# Patient Record
Sex: Female | Born: 1965 | Race: Black or African American | Hispanic: No | Marital: Married | Smoking: Never smoker
Health system: Southern US, Community
[De-identification: ages and names within clinical notes are randomized; demographics above are authoritative.]

## PROBLEM LIST (undated history)

## (undated) DIAGNOSIS — J45909 Unspecified asthma, uncomplicated: Secondary | ICD-10-CM

## (undated) DIAGNOSIS — I10 Essential (primary) hypertension: Secondary | ICD-10-CM

## (undated) DIAGNOSIS — Z8489 Family history of other specified conditions: Secondary | ICD-10-CM

## (undated) DIAGNOSIS — G43909 Migraine, unspecified, not intractable, without status migrainosus: Secondary | ICD-10-CM

## (undated) DIAGNOSIS — F419 Anxiety disorder, unspecified: Secondary | ICD-10-CM

## (undated) DIAGNOSIS — T7840XA Allergy, unspecified, initial encounter: Secondary | ICD-10-CM

## (undated) DIAGNOSIS — G4700 Insomnia, unspecified: Secondary | ICD-10-CM

## (undated) DIAGNOSIS — K219 Gastro-esophageal reflux disease without esophagitis: Secondary | ICD-10-CM

## (undated) HISTORY — DX: Migraine, unspecified, not intractable, without status migrainosus: G43.909

## (undated) HISTORY — DX: Anxiety disorder, unspecified: F41.9

## (undated) HISTORY — PX: TONSILLECTOMY: SUR1361

## (undated) HISTORY — DX: Unspecified asthma, uncomplicated: J45.909

## (undated) HISTORY — PX: EYE SURGERY: SHX253

## (undated) HISTORY — PX: COLONOSCOPY: SHX174

## (undated) HISTORY — DX: Insomnia, unspecified: G47.00

## (undated) HISTORY — DX: Gastro-esophageal reflux disease without esophagitis: K21.9

## (undated) HISTORY — PX: OTHER SURGICAL HISTORY: SHX169

## (undated) HISTORY — DX: Allergy, unspecified, initial encounter: T78.40XA

---

## 2003-09-30 DIAGNOSIS — I10 Essential (primary) hypertension: Secondary | ICD-10-CM

## 2003-09-30 HISTORY — DX: Essential (primary) hypertension: I10

## 2011-02-22 ENCOUNTER — Emergency Department (HOSPITAL_COMMUNITY): Payer: PRIVATE HEALTH INSURANCE

## 2011-02-22 ENCOUNTER — Emergency Department (HOSPITAL_COMMUNITY)
Admission: EM | Admit: 2011-02-22 | Discharge: 2011-02-22 | Disposition: A | Discharge status: Home / Self Care | Payer: PRIVATE HEALTH INSURANCE | Attending: Emergency Medicine | Admitting: Emergency Medicine

## 2011-02-22 DIAGNOSIS — R059 Cough, unspecified: Secondary | ICD-10-CM | POA: Insufficient documentation

## 2011-02-22 DIAGNOSIS — R0609 Other forms of dyspnea: Secondary | ICD-10-CM | POA: Insufficient documentation

## 2011-02-22 DIAGNOSIS — R079 Chest pain, unspecified: Secondary | ICD-10-CM | POA: Insufficient documentation

## 2011-02-22 DIAGNOSIS — R0602 Shortness of breath: Secondary | ICD-10-CM | POA: Insufficient documentation

## 2011-02-22 DIAGNOSIS — R0989 Other specified symptoms and signs involving the circulatory and respiratory systems: Secondary | ICD-10-CM | POA: Insufficient documentation

## 2011-02-22 DIAGNOSIS — R05 Cough: Secondary | ICD-10-CM | POA: Insufficient documentation

## 2011-02-22 DIAGNOSIS — R062 Wheezing: Secondary | ICD-10-CM | POA: Insufficient documentation

## 2011-02-22 DIAGNOSIS — M542 Cervicalgia: Secondary | ICD-10-CM | POA: Insufficient documentation

## 2011-02-22 DIAGNOSIS — R609 Edema, unspecified: Secondary | ICD-10-CM | POA: Insufficient documentation

## 2011-02-22 LAB — CK TOTAL AND CKMB (NOT AT ARMC)
CK, MB: 1.5 ng/mL (ref 0.3–4.0)
Relative Index: 0.8 (ref 0.0–2.5)

## 2011-02-22 LAB — CBC
HCT: 37.3 % (ref 36.0–46.0)
Hemoglobin: 12.2 g/dL (ref 12.0–15.0)
MCHC: 32.7 g/dL (ref 30.0–36.0)
RBC: 4.27 MIL/uL (ref 3.87–5.11)
WBC: 5.7 10*3/uL (ref 4.0–10.5)

## 2011-02-22 LAB — DIFFERENTIAL
Basophils Absolute: 0 10*3/uL (ref 0.0–0.1)
Lymphs Abs: 3 10*3/uL (ref 0.7–4.0)
Monocytes Absolute: 0.5 10*3/uL (ref 0.1–1.0)
Neutrophils Relative %: 36 % — ABNORMAL LOW (ref 43–77)

## 2011-02-22 LAB — TROPONIN I: Troponin I: <0.3 ng/mL (ref <0.30)

## 2011-02-22 LAB — POCT I-STAT, CHEM 8
BUN: 9 mg/dL (ref 6–23)
Calcium, Ion: 0.92 mmol/L — ABNORMAL LOW (ref 1.12–1.32)
Chloride: 108 mEq/L (ref 96–112)
Glucose, Bld: 90 mg/dL (ref 70–99)
HCT: 39 % (ref 36.0–46.0)
TCO2: 23 mmol/L (ref 0–100)

## 2011-02-22 LAB — D-DIMER, QUANTITATIVE: D-Dimer, Quant: 0.37 ug/mL-FEU (ref 0.00–0.48)

## 2012-04-27 IMAGING — CR DG CHEST 2V
2 series · 2 of 2 positions shown · non-contrast
Comparison: None.

CLINICAL DATA: Chest pain and shortness of breath.

CHEST - 2 VIEW

[w chest pa]
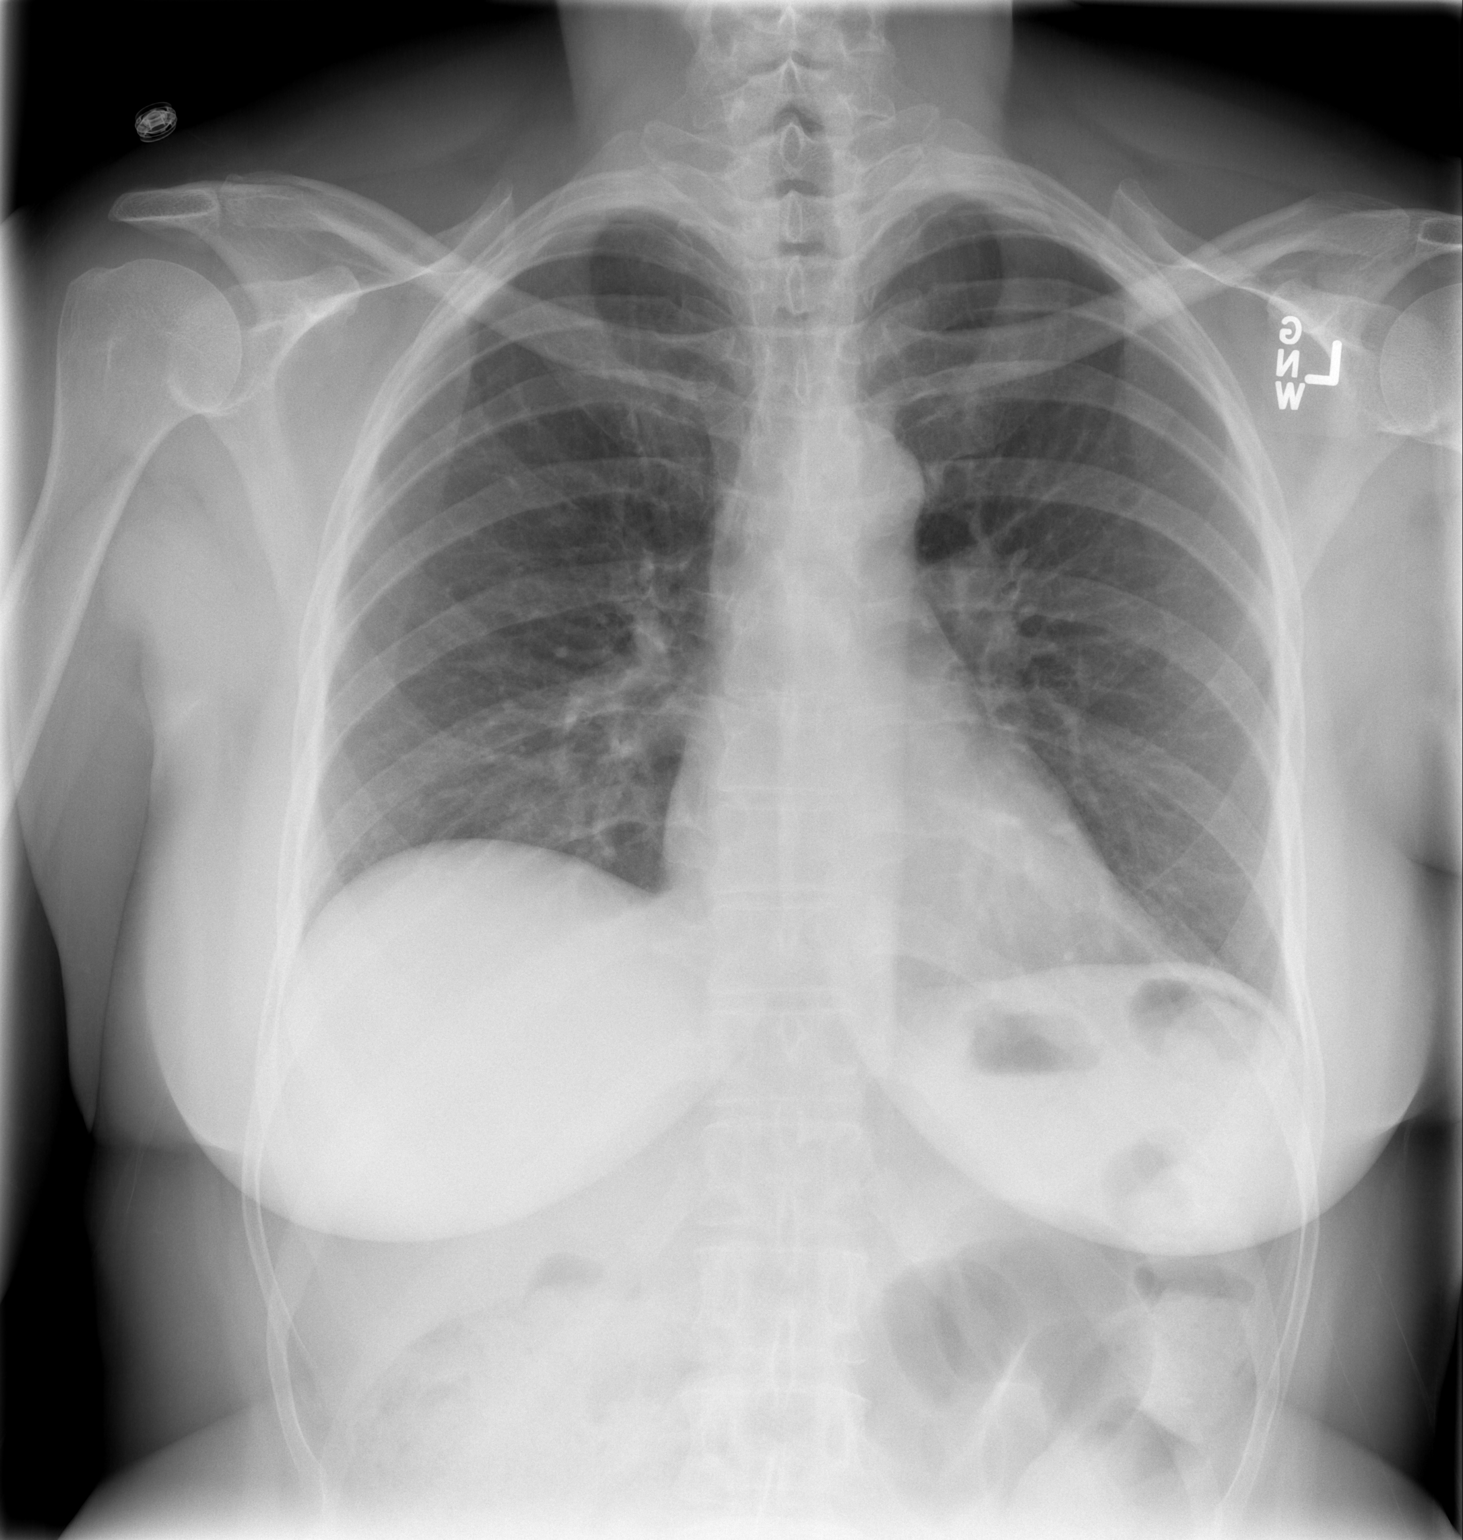

[w chest lat]
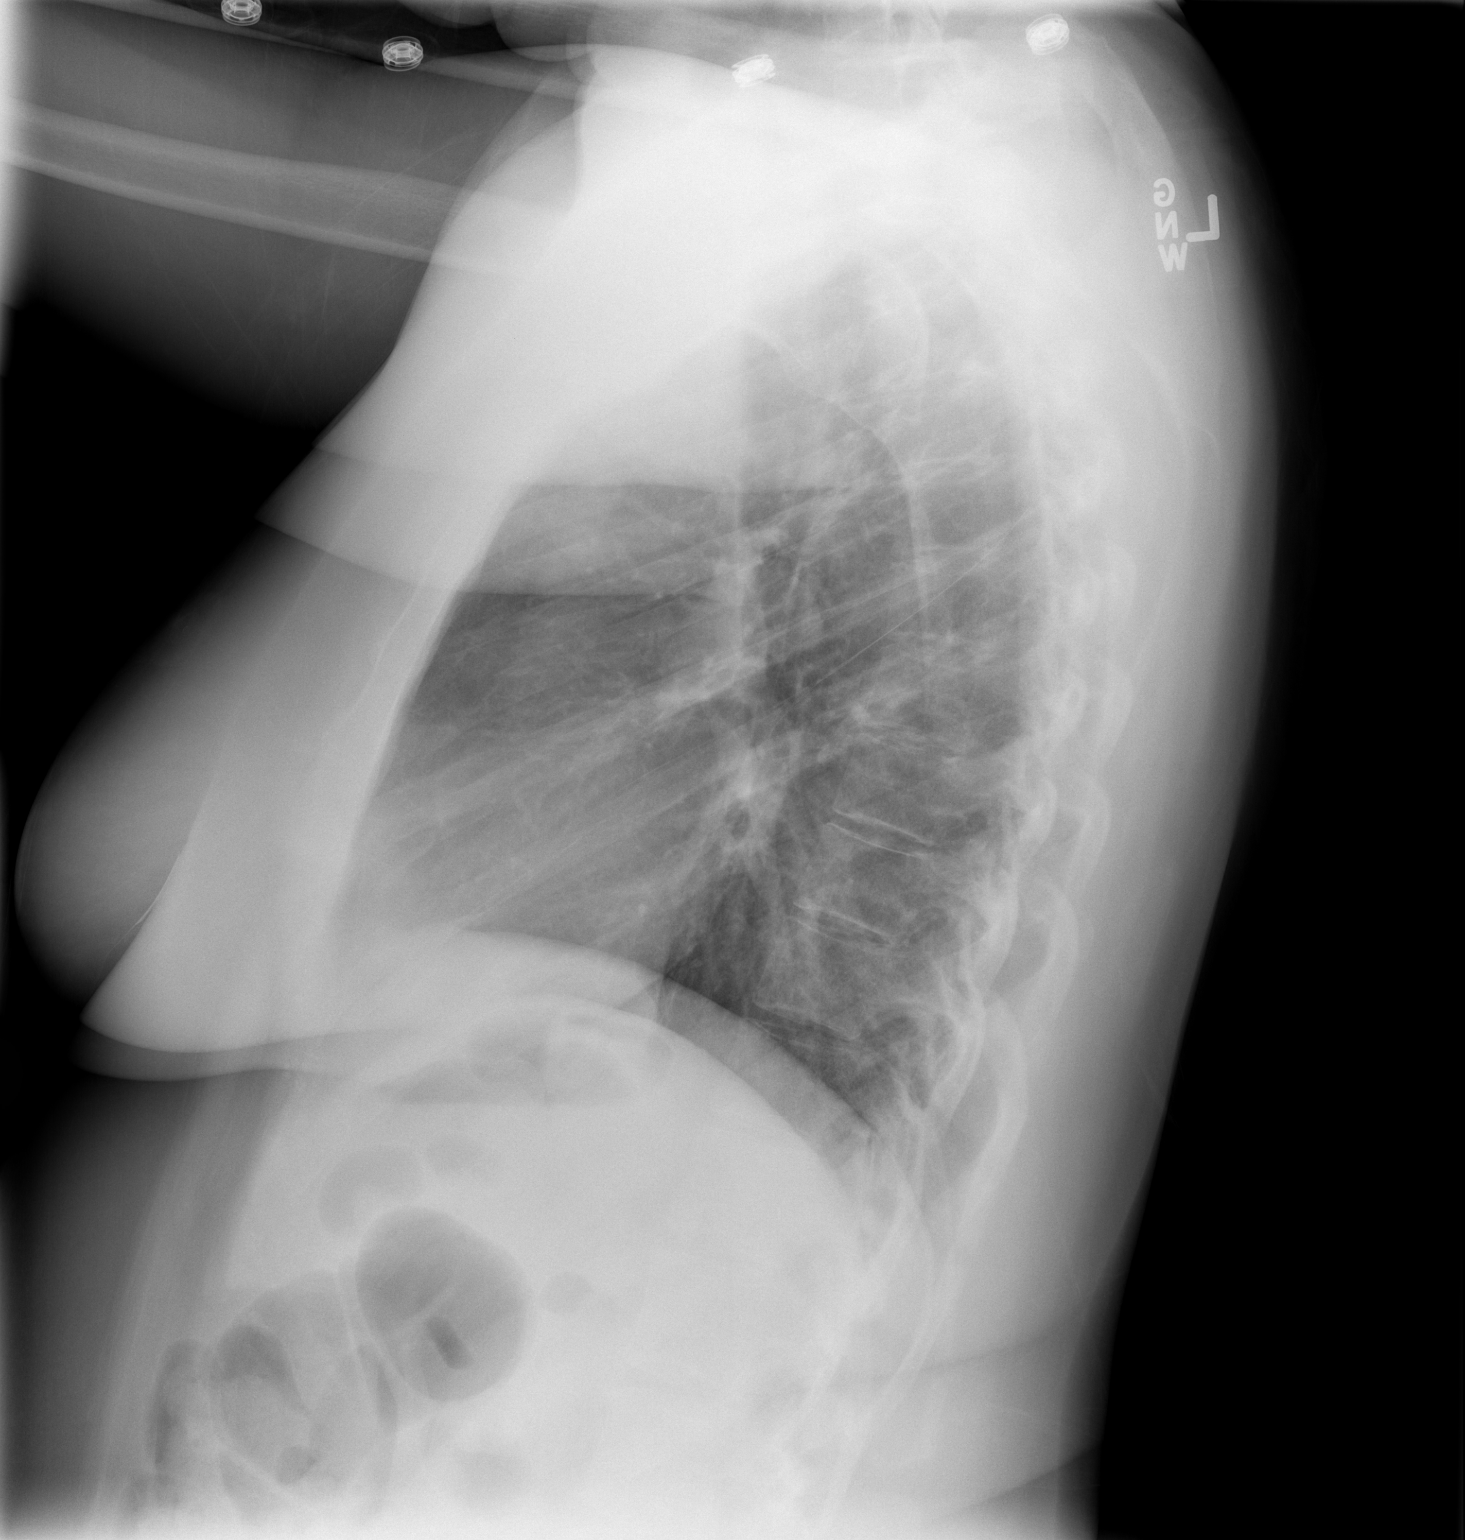

[2 of 2 positions shown; findings below may reference images not displayed]

FINDINGS: The lungs are well-aerated and clear.  There is no
evidence of focal opacification, pleural effusion or pneumothorax.

The heart is normal in size; the mediastinal contour is within
normal limits.  No acute osseous abnormalities are seen.
IMPRESSION: No acute cardiopulmonary process seen.

## 2012-06-15 ENCOUNTER — Other Ambulatory Visit: Payer: Self-pay | Admitting: Gastroenterology

## 2012-06-15 DIAGNOSIS — R1011 Right upper quadrant pain: Secondary | ICD-10-CM

## 2012-06-15 DIAGNOSIS — R110 Nausea: Secondary | ICD-10-CM

## 2012-06-22 ENCOUNTER — Encounter (HOSPITAL_COMMUNITY)
Admission: RE | Admit: 2012-06-22 | Discharge: 2012-06-22 | Disposition: A | Discharge status: Home / Self Care | Payer: BC Managed Care – PPO | Source: Ambulatory Visit | Attending: Gastroenterology | Admitting: Gastroenterology

## 2012-06-22 ENCOUNTER — Ambulatory Visit (HOSPITAL_COMMUNITY)
Admission: RE | Admit: 2012-06-22 | Discharge: 2012-06-22 | Disposition: A | Discharge status: Home / Self Care | Payer: BC Managed Care – PPO | Source: Ambulatory Visit | Attending: Gastroenterology | Admitting: Gastroenterology

## 2012-06-22 DIAGNOSIS — R110 Nausea: Secondary | ICD-10-CM

## 2012-06-22 DIAGNOSIS — R1011 Right upper quadrant pain: Secondary | ICD-10-CM

## 2012-06-22 MED ORDER — SINCALIDE 5 MCG IJ SOLR
0.0200 ug/kg | Freq: Once | INTRAMUSCULAR | Status: AC
  Administered 2012-06-22: 2 ug via INTRAVENOUS

## 2012-06-22 MED ORDER — TECHNETIUM TC 99M MEBROFENIN IV KIT
5.0000 | PACK | Freq: Once | INTRAVENOUS | Status: AC | PRN
  Administered 2012-06-22: 5 via INTRAVENOUS

## 2012-07-05 ENCOUNTER — Encounter (INDEPENDENT_AMBULATORY_CARE_PROVIDER_SITE_OTHER): Payer: Self-pay | Admitting: Surgery

## 2012-07-14 ENCOUNTER — Ambulatory Visit (INDEPENDENT_AMBULATORY_CARE_PROVIDER_SITE_OTHER): Payer: BC Managed Care – PPO | Admitting: Surgery

## 2012-07-14 ENCOUNTER — Encounter (INDEPENDENT_AMBULATORY_CARE_PROVIDER_SITE_OTHER): Payer: Self-pay | Admitting: Surgery

## 2012-07-14 VITALS — BP 124/81 | HR 68 | Temp 98.1°F | Resp 12 | Ht 68.0 in | Wt 214.2 lb

## 2012-07-14 DIAGNOSIS — K811 Chronic cholecystitis: Secondary | ICD-10-CM

## 2012-07-14 NOTE — Progress Notes (Signed)
Patient ID: Teresa Hunter, female   DOB: [birth year], 46 y.o.   MRN: [id number]  Chief Complaint  Patient presents with  . Other    RUQ abdominal pain    HPI Teresa Hunter is a 46 y.o. female.   HPI This is a pleasant female referred by Dr. Loreta Ave for evaluation of right upper quadrant abdominal pain. This has been going on for about 3 years. They were first the epigastrium. It is episodic. Sometimes it is related to fatty meals and other times it occur spontaneously. It is mild to moderate in intensity. She has nausea but no vomiting. Bowel movements are normal. Past Medical History  Diagnosis Date  . Migraine headache   . Insomnia   . Anxiety   . GERD (gastroesophageal reflux disease)     Past Surgical History  Procedure Date  . Tonsillectomy   . Laser     B/L glaucoma    History reviewed. No pertinent family history.  Social History History  Substance Use Topics  . Smoking status: Never Smoker   . Smokeless tobacco: Not on file  . Alcohol Use: No    No Known Allergies  Current Outpatient Prescriptions  Medication Sig Dispense Refill  . ALPRAZolam (XANAX PO) Take by mouth.      . Esomeprazole Magnesium (NEXIUM PO) Take by mouth.      . Zolpidem Tartrate (AMBIEN PO) Take by mouth.        Review of Systems Review of Systems  Constitutional: Negative for fever, chills and unexpected weight change.  HENT: Negative for hearing loss, congestion, sore throat, trouble swallowing and voice change.   Eyes: Negative for visual disturbance.  Respiratory: Negative for cough and wheezing.   Cardiovascular: Negative for chest pain, palpitations and leg swelling.  Gastrointestinal: Positive for nausea, abdominal pain and abdominal distention. Negative for vomiting, diarrhea, constipation, blood in stool and anal bleeding.  Genitourinary: Negative for hematuria, vaginal bleeding and difficulty urinating.  Musculoskeletal: Negative for arthralgias.  Skin: Negative for rash and  wound.  Neurological: Negative for seizures, syncope and headaches.  Hematological: Negative for adenopathy. Does not bruise/bleed easily.  Psychiatric/Behavioral: Negative for confusion.    Blood pressure 124/81, pulse 68, temperature 98.1 F (36.7 C), temperature source Temporal, resp. rate 12, height 5\' 8"  (1.727 m), weight 214 lb 3.2 oz (97.16 kg), last menstrual period 11/23/2011.  Physical Exam Physical Exam  Constitutional: She is oriented to person, place, and time. She appears well-nourished. No distress.  HENT:  Head: Normocephalic and atraumatic.  Right Ear: External ear normal.  Left Ear: External ear normal.  Nose: Nose normal.  Mouth/Throat: Oropharynx is clear and moist. No oropharyngeal exudate.  Eyes: Conjunctivae normal are normal. Pupils are equal, round, and reactive to light. Right eye exhibits no discharge. Left eye exhibits no discharge. No scleral icterus.  Neck: Normal range of motion. Neck supple. No tracheal deviation present. No thyromegaly present.  Cardiovascular: Normal rate, regular rhythm, normal heart sounds and intact distal pulses.   No murmur heard. Pulmonary/Chest: Effort normal and breath sounds normal. No respiratory distress. She has no wheezes. She has no rales.  Abdominal: Soft. Bowel sounds are normal. She exhibits no distension. There is no tenderness. There is no rebound.  Musculoskeletal: Normal range of motion. She exhibits no edema and no tenderness.  Lymphadenopathy:    She has no cervical adenopathy.  Neurological: She is alert and oriented to person, place, and time.  Skin: Skin is warm and dry. No rash noted.  She is not diaphoretic. No erythema.  Psychiatric: Her behavior is normal. Judgment normal.    Data Reviewed I have reviewed the notes including her ultrasound, HIDA scan, and liver function tests which are normal  Assessment    Biliary dyskinesia    Plan    I suspect she has dyskinesia and probable chronic  cholecystitis. I discussed this with the patient and her husband. They wish to proceed with left upper cholecystectomy and possible cholangiogram. I discussed the risks of surgery which includes but is not limited to bleeding, infection, bile duct injury, bile leak, injury to other structures, the need to convert to an open procedure, the chance this may not resolve her symptoms, etc. I also discussed postoperative recovery. They understand and wish to proceed. Likelihood of success is good       Talon Witting A 07/14/2012, 11:14 AM

## 2012-07-22 ENCOUNTER — Encounter (HOSPITAL_COMMUNITY): Payer: Self-pay | Admitting: Pharmacy Technician

## 2012-07-27 NOTE — Pre-Procedure Instructions (Signed)
20 Teresa Hunter  07/27/2012   Your procedure is scheduled on:  Friday August 06, 2012  Report to [facility] Stay [facility] at 8:00 AM.  Call this number if you have problems the morning of surgery: [phone]   Remember:   Do not eat food or drink :After Midnight.      Take these medicines the morning of surgery with A SIP OF WATER: xanax, nexium, tramadol   Do not wear jewelry, make-up or nail polish.  Do not wear lotions, powders, or perfumes.   Do not shave 48 hours prior to surgery. Men may shave face and neck.  Do not bring valuables to the hospital.  Contacts, dentures or bridgework may not be worn into surgery.  Leave suitcase in the car. After surgery it may be brought to your room.  For patients admitted to the hospital, checkout time is 11:00 AM the day of discharge.   Patients discharged the day of surgery will not be allowed to drive home.  Name and phone number of your driver: family / friend  Special Instructions: Shower using CHG 2 nights before surgery and the night before surgery.  If you shower the day of surgery use CHG.  Use special wash - you have one bottle of CHG for all showers.  You should use approximately 1/3 of the bottle for each shower.   Please read over the following fact sheets that you were given: Pain Booklet, Coughing and Deep Breathing, MRSA Information and Surgical Site Infection Prevention

## 2012-07-28 ENCOUNTER — Encounter (HOSPITAL_COMMUNITY)
Admission: RE | Admit: 2012-07-28 | Discharge: 2012-07-28 | Disposition: A | Discharge status: Home / Self Care | Payer: BC Managed Care – PPO | Source: Ambulatory Visit | Attending: Surgery | Admitting: Surgery

## 2012-07-28 ENCOUNTER — Ambulatory Visit (HOSPITAL_COMMUNITY)
Admission: RE | Admit: 2012-07-28 | Discharge: 2012-07-28 | Disposition: A | Discharge status: Home / Self Care | Payer: BC Managed Care – PPO | Source: Ambulatory Visit | Attending: Surgery | Admitting: Surgery

## 2012-07-28 ENCOUNTER — Encounter (HOSPITAL_COMMUNITY): Payer: Self-pay

## 2012-07-28 DIAGNOSIS — Z01812 Encounter for preprocedural laboratory examination: Secondary | ICD-10-CM | POA: Insufficient documentation

## 2012-07-28 DIAGNOSIS — Z01818 Encounter for other preprocedural examination: Secondary | ICD-10-CM | POA: Insufficient documentation

## 2012-07-28 DIAGNOSIS — Z01810 Encounter for preprocedural cardiovascular examination: Secondary | ICD-10-CM | POA: Insufficient documentation

## 2012-07-28 HISTORY — DX: Essential (primary) hypertension: I10

## 2012-07-28 HISTORY — DX: Family history of other specified conditions: Z84.89

## 2012-07-28 LAB — BASIC METABOLIC PANEL
CO2: 26 mEq/L (ref 19–32)
Calcium: 9.3 mg/dL (ref 8.4–10.5)
Chloride: 102 mEq/L (ref 96–112)
Glucose, Bld: 101 mg/dL — ABNORMAL HIGH (ref 70–99)
Sodium: 136 mEq/L (ref 135–145)

## 2012-07-28 LAB — CBC
Hemoglobin: 12.9 g/dL (ref 12.0–15.0)
MCH: 29.5 pg (ref 26.0–34.0)
RBC: 4.37 MIL/uL (ref 3.87–5.11)
WBC: 4.5 10*3/uL (ref 4.0–10.5)

## 2012-07-28 LAB — SURGICAL PCR SCREEN: Staphylococcus aureus: NEGATIVE

## 2012-07-29 ENCOUNTER — Other Ambulatory Visit (HOSPITAL_COMMUNITY): Payer: BC Managed Care – PPO

## 2012-08-05 MED ORDER — CEFAZOLIN SODIUM-DEXTROSE 2-3 GM-% IV SOLR
2.0000 g | INTRAVENOUS | Status: AC
  Administered 2012-08-06: 2 g via INTRAVENOUS
  Filled 2012-08-05: qty 50

## 2012-08-05 NOTE — H&P (Signed)
Patient ID: Teresa Hunter, female DOB: 10/10/1965, 46 y.o. MRN: [id number]  Chief Complaint   Patient presents with   .  Other     RUQ abdominal pain    HPI  Teresa Hunter is a 46 y.o. female.  HPI  This is a pleasant female referred by Dr. Loreta Ave for evaluation of right upper quadrant abdominal pain. This has been going on for about 3 years. They were first the epigastrium. It is episodic. Sometimes it is related to fatty meals and other times it occur spontaneously. It is mild to moderate in intensity. She has nausea but no vomiting. Bowel movements are normal.  Past Medical History   Diagnosis  Date   .  Migraine headache    .  Insomnia    .  Anxiety    .  GERD (gastroesophageal reflux disease)     Past Surgical History   Procedure  Date   .  Tonsillectomy    .  Laser      B/L glaucoma    History reviewed. No pertinent family history.  Social History  History   Substance Use Topics   .  Smoking status:  Never Smoker   .  Smokeless tobacco:  Not on file   .  Alcohol Use:  No    No Known Allergies  Current Outpatient Prescriptions   Medication  Sig  Dispense  Refill   .  ALPRAZolam (XANAX PO)  Take by mouth.     .  Esomeprazole Magnesium (NEXIUM PO)  Take by mouth.     .  Zolpidem Tartrate (AMBIEN PO)  Take by mouth.      Review of Systems  Review of Systems  Constitutional: Negative for fever, chills and unexpected weight change.  HENT: Negative for hearing loss, congestion, sore throat, trouble swallowing and voice change.  Eyes: Negative for visual disturbance.  Respiratory: Negative for cough and wheezing.  Cardiovascular: Negative for chest pain, palpitations and leg swelling.  Gastrointestinal: Positive for nausea, abdominal pain and abdominal distention. Negative for vomiting, diarrhea, constipation, blood in stool and anal bleeding.  Genitourinary: Negative for hematuria, vaginal bleeding and difficulty urinating.  Musculoskeletal: Negative for arthralgias.    Skin: Negative for rash and wound.  Neurological: Negative for seizures, syncope and headaches.  Hematological: Negative for adenopathy. Does not bruise/bleed easily.  Psychiatric/Behavioral: Negative for confusion.   Blood pressure 124/81, pulse 68, temperature 98.1 F (36.7 C), temperature source Temporal, resp. rate 12, height 5\' 8"  (1.727 m), weight 214 lb 3.2 oz (97.16 kg), last menstrual period 11/23/2011.  Physical Exam  Physical Exam  Constitutional: She is oriented to person, place, and time. She appears well-nourished. No distress.  HENT:  Head: Normocephalic and atraumatic.  Right Ear: External ear normal.  Left Ear: External ear normal.  Nose: Nose normal.  Mouth/Throat: Oropharynx is clear and moist. No oropharyngeal exudate.  Eyes: Conjunctivae normal are normal. Pupils are equal, round, and reactive to light. Right eye exhibits no discharge. Left eye exhibits no discharge. No scleral icterus.  Neck: Normal range of motion. Neck supple. No tracheal deviation present. No thyromegaly present.  Cardiovascular: Normal rate, regular rhythm, normal heart sounds and intact distal pulses.  No murmur heard.  Pulmonary/Chest: Effort normal and breath sounds normal. No respiratory distress. She has no wheezes. She has no rales.  Abdominal: Soft. Bowel sounds are normal. She exhibits no distension. There is no tenderness. There is no rebound.  Musculoskeletal: Normal range of motion. She exhibits no  edema and no tenderness.  Lymphadenopathy:  She has no cervical adenopathy.  Neurological: She is alert and oriented to person, place, and time.  Skin: Skin is warm and dry. No rash noted. She is not diaphoretic. No erythema.  Psychiatric: Her behavior is normal. Judgment normal.   Data Reviewed  I have reviewed the notes including her ultrasound, HIDA scan, and liver function tests which are normal  Assessment   Biliary dyskinesia   Plan   I suspect she has dyskinesia and  probable chronic cholecystitis. I discussed this with the patient and her husband. They wish to proceed with left upper cholecystectomy and possible cholangiogram. I discussed the risks of surgery which includes but is not limited to bleeding, infection, bile duct injury, bile leak, injury to other structures, the need to convert to an open procedure, the chance this may not resolve her symptoms, etc. I also discussed postoperative recovery. They understand and wish to proceed. Likelihood of success is good   Gatsby Chismar A

## 2012-08-06 ENCOUNTER — Ambulatory Visit (HOSPITAL_COMMUNITY): Payer: BC Managed Care – PPO | Admitting: Anesthesiology

## 2012-08-06 ENCOUNTER — Ambulatory Visit (HOSPITAL_COMMUNITY)
Admission: RE | Admit: 2012-08-06 | Discharge: 2012-08-06 | Disposition: A | Discharge status: Home / Self Care | Payer: BC Managed Care – PPO | Source: Ambulatory Visit | Attending: Surgery | Admitting: Surgery

## 2012-08-06 ENCOUNTER — Encounter (HOSPITAL_COMMUNITY)
Admission: RE | Disposition: A | Discharge status: Home / Self Care | Payer: Self-pay | Source: Ambulatory Visit | Attending: Surgery

## 2012-08-06 ENCOUNTER — Encounter (HOSPITAL_COMMUNITY): Payer: Self-pay | Admitting: *Deleted

## 2012-08-06 ENCOUNTER — Encounter (HOSPITAL_COMMUNITY): Payer: Self-pay | Admitting: Anesthesiology

## 2012-08-06 DIAGNOSIS — G43909 Migraine, unspecified, not intractable, without status migrainosus: Secondary | ICD-10-CM | POA: Insufficient documentation

## 2012-08-06 DIAGNOSIS — Z79899 Other long term (current) drug therapy: Secondary | ICD-10-CM | POA: Insufficient documentation

## 2012-08-06 DIAGNOSIS — G4700 Insomnia, unspecified: Secondary | ICD-10-CM | POA: Insufficient documentation

## 2012-08-06 DIAGNOSIS — K819 Cholecystitis, unspecified: Secondary | ICD-10-CM | POA: Insufficient documentation

## 2012-08-06 DIAGNOSIS — K219 Gastro-esophageal reflux disease without esophagitis: Secondary | ICD-10-CM | POA: Insufficient documentation

## 2012-08-06 DIAGNOSIS — K811 Chronic cholecystitis: Secondary | ICD-10-CM

## 2012-08-06 DIAGNOSIS — K824 Cholesterolosis of gallbladder: Secondary | ICD-10-CM

## 2012-08-06 DIAGNOSIS — F411 Generalized anxiety disorder: Secondary | ICD-10-CM | POA: Insufficient documentation

## 2012-08-06 HISTORY — PX: CHOLECYSTECTOMY: SHX55

## 2012-08-06 SURGERY — LAPAROSCOPIC CHOLECYSTECTOMY
Anesthesia: General | Site: Abdomen | Wound class: Contaminated

## 2012-08-06 MED ORDER — OXYCODONE HCL 5 MG/5ML PO SOLN: 5.0000 mg | Freq: Once | ORAL | Status: AC | PRN

## 2012-08-06 MED ORDER — HYDROMORPHONE HCL PF 1 MG/ML IJ SOLN
INTRAMUSCULAR | Status: AC
  Filled 2012-08-06: qty 1

## 2012-08-06 MED ORDER — ONDANSETRON HCL 4 MG/2ML IJ SOLN
INTRAMUSCULAR | Status: DC | PRN
  Administered 2012-08-06: 4 mg via INTRAVENOUS

## 2012-08-06 MED ORDER — LABETALOL HCL 5 MG/ML IV SOLN
INTRAVENOUS | Status: DC | PRN
  Administered 2012-08-06: 10 mg via INTRAVENOUS

## 2012-08-06 MED ORDER — KETOROLAC TROMETHAMINE 30 MG/ML IJ SOLN
INTRAMUSCULAR | Status: DC | PRN
  Administered 2012-08-06: 30 mg via INTRAVENOUS

## 2012-08-06 MED ORDER — FENTANYL CITRATE 0.05 MG/ML IJ SOLN: 50.0000 ug | Freq: Once | INTRAMUSCULAR | Status: DC

## 2012-08-06 MED ORDER — GLYCOPYRROLATE 0.2 MG/ML IJ SOLN
INTRAMUSCULAR | Status: DC | PRN
  Administered 2012-08-06: 0.4 mg via INTRAVENOUS

## 2012-08-06 MED ORDER — MIDAZOLAM HCL 2 MG/2ML IJ SOLN: 1.0000 mg | INTRAMUSCULAR | Status: DC | PRN

## 2012-08-06 MED ORDER — OXYCODONE HCL 5 MG PO TABS: 5.0000 mg | ORAL_TABLET | ORAL | Status: DC | PRN

## 2012-08-06 MED ORDER — LACTATED RINGERS IV SOLN
INTRAVENOUS | Status: DC
  Administered 2012-08-06: 08:00:00 via INTRAVENOUS

## 2012-08-06 MED ORDER — ROCURONIUM BROMIDE 100 MG/10ML IV SOLN
INTRAVENOUS | Status: DC | PRN
  Administered 2012-08-06: 30 mg via INTRAVENOUS

## 2012-08-06 MED ORDER — HYDROMORPHONE HCL PF 1 MG/ML IJ SOLN
0.2500 mg | INTRAMUSCULAR | Status: DC | PRN
  Administered 2012-08-06 (×2): 0.5 mg via INTRAVENOUS

## 2012-08-06 MED ORDER — MIDAZOLAM HCL 5 MG/5ML IJ SOLN
INTRAMUSCULAR | Status: DC | PRN
  Administered 2012-08-06: 2 mg via INTRAVENOUS

## 2012-08-06 MED ORDER — OXYCODONE-ACETAMINOPHEN 5-325 MG PO TABS: ORAL_TABLET | ORAL | Status: DC

## 2012-08-06 MED ORDER — BUPIVACAINE-EPINEPHRINE PF 0.25-1:200000 % IJ SOLN
INTRAMUSCULAR | Status: AC
  Filled 2012-08-06: qty 30

## 2012-08-06 MED ORDER — LIDOCAINE HCL (CARDIAC) 20 MG/ML IV SOLN
INTRAVENOUS | Status: DC | PRN
  Administered 2012-08-06: 80 mg via INTRAVENOUS

## 2012-08-06 MED ORDER — OXYCODONE HCL 5 MG PO TABS
ORAL_TABLET | ORAL | Status: AC
  Filled 2012-08-06: qty 1

## 2012-08-06 MED ORDER — OXYCODONE HCL 5 MG PO TABS
5.0000 mg | ORAL_TABLET | Freq: Once | ORAL | Status: AC | PRN
  Administered 2012-08-06: 5 mg via ORAL

## 2012-08-06 MED ORDER — PROMETHAZINE HCL 25 MG/ML IJ SOLN: 6.2500 mg | INTRAMUSCULAR | Status: DC | PRN

## 2012-08-06 MED ORDER — 0.9 % SODIUM CHLORIDE (POUR BTL) OPTIME
TOPICAL | Status: DC | PRN
  Administered 2012-08-06: 1000 mL

## 2012-08-06 MED ORDER — BUPIVACAINE-EPINEPHRINE 0.25% -1:200000 IJ SOLN
INTRAMUSCULAR | Status: DC | PRN
  Administered 2012-08-06: 20 mL

## 2012-08-06 MED ORDER — FENTANYL CITRATE 0.05 MG/ML IJ SOLN
INTRAMUSCULAR | Status: DC | PRN
  Administered 2012-08-06: 50 ug via INTRAVENOUS
  Administered 2012-08-06: 100 ug via INTRAVENOUS

## 2012-08-06 MED ORDER — NEOSTIGMINE METHYLSULFATE 1 MG/ML IJ SOLN
INTRAMUSCULAR | Status: DC | PRN
  Administered 2012-08-06: 3 mg via INTRAVENOUS

## 2012-08-06 MED ORDER — PROPOFOL 10 MG/ML IV BOLUS
INTRAVENOUS | Status: DC | PRN
  Administered 2012-08-06: 180 mg via INTRAVENOUS

## 2012-08-06 MED ORDER — SODIUM CHLORIDE 0.9 % IR SOLN
Status: DC | PRN
  Administered 2012-08-06: 1000 mL

## 2012-08-06 SURGICAL SUPPLY — 40 items
APPLIER CLIP 5 13 M/L LIGAMAX5 (MISCELLANEOUS) ×2
BANDAGE ADHESIVE 1X3 (GAUZE/BANDAGES/DRESSINGS) ×8 IMPLANT
BENZOIN TINCTURE PRP APPL 2/3 (GAUZE/BANDAGES/DRESSINGS) ×2 IMPLANT
CANISTER SUCTION 2500CC (MISCELLANEOUS) ×2 IMPLANT
CHLORAPREP W/TINT 26ML (MISCELLANEOUS) ×2 IMPLANT
CLIP APPLIE 5 13 M/L LIGAMAX5 (MISCELLANEOUS) ×1 IMPLANT
CLOTH BEACON ORANGE TIMEOUT ST (SAFETY) ×2 IMPLANT
COVER MAYO STAND STRL (DRAPES) IMPLANT
COVER SURGICAL LIGHT HANDLE (MISCELLANEOUS) ×2 IMPLANT
DECANTER SPIKE VIAL GLASS SM (MISCELLANEOUS) ×2 IMPLANT
DRAPE C-ARM 42X72 X-RAY (DRAPES) IMPLANT
ELECT REM PT RETURN 9FT ADLT (ELECTROSURGICAL) ×2
ELECTRODE REM PT RTRN 9FT ADLT (ELECTROSURGICAL) ×1 IMPLANT
GLOVE BIO SURGEON STRL SZ7.5 (GLOVE) ×2 IMPLANT
GLOVE BIOGEL PI IND STRL 6.5 (GLOVE) ×1 IMPLANT
GLOVE BIOGEL PI IND STRL 7.5 (GLOVE) ×1 IMPLANT
GLOVE BIOGEL PI INDICATOR 6.5 (GLOVE) ×1
GLOVE BIOGEL PI INDICATOR 7.5 (GLOVE) ×1
GLOVE SURG SIGNA 7.5 PF LTX (GLOVE) ×2 IMPLANT
GLOVE SURG SS PI 6.5 STRL IVOR (GLOVE) ×2 IMPLANT
GOWN PREVENTION PLUS XLARGE (GOWN DISPOSABLE) ×2 IMPLANT
GOWN STRL NON-REIN LRG LVL3 (GOWN DISPOSABLE) ×6 IMPLANT
KIT BASIN OR (CUSTOM PROCEDURE TRAY) ×2 IMPLANT
KIT ROOM TURNOVER OR (KITS) ×2 IMPLANT
NS IRRIG 1000ML POUR BTL (IV SOLUTION) ×2 IMPLANT
PAD ARMBOARD 7.5X6 YLW CONV (MISCELLANEOUS) ×4 IMPLANT
POUCH SPECIMEN RETRIEVAL 10MM (ENDOMECHANICALS) ×2 IMPLANT
SCISSORS LAP 5X35 DISP (ENDOMECHANICALS) ×2 IMPLANT
SET CHOLANGIOGRAPH 5 50 .035 (SET/KITS/TRAYS/PACK) IMPLANT
SET IRRIG TUBING LAPAROSCOPIC (IRRIGATION / IRRIGATOR) ×2 IMPLANT
SLEEVE ENDOPATH XCEL 5M (ENDOMECHANICALS) ×4 IMPLANT
SPECIMEN JAR SMALL (MISCELLANEOUS) ×2 IMPLANT
SUT MON AB 4-0 PC3 18 (SUTURE) ×2 IMPLANT
SUT VICRYL 0 UR6 27IN ABS (SUTURE) ×2 IMPLANT
TOWEL OR 17X24 6PK STRL BLUE (TOWEL DISPOSABLE) ×2 IMPLANT
TOWEL OR 17X26 10 PK STRL BLUE (TOWEL DISPOSABLE) ×2 IMPLANT
TRAY LAPAROSCOPIC (CUSTOM PROCEDURE TRAY) ×2 IMPLANT
TROCAR XCEL BLUNT TIP 100MML (ENDOMECHANICALS) ×2 IMPLANT
TROCAR XCEL NON-BLD 5MMX100MML (ENDOMECHANICALS) ×2 IMPLANT
WATER STERILE IRR 1000ML POUR (IV SOLUTION) IMPLANT

## 2012-08-06 NOTE — Anesthesia Procedure Notes (Signed)
Procedure Name: Intubation Date/Time: 08/06/2012 8:54 AM Performed by: Arlice Colt B Pre-anesthesia Checklist: Patient identified, Emergency Drugs available, Suction available, Patient being monitored and Timeout performed Patient Re-evaluated:Patient Re-evaluated prior to inductionOxygen Delivery Method: Circle system utilized Preoxygenation: Pre-oxygenation with 100% oxygen Intubation Type: IV induction Ventilation: Mask ventilation without difficulty Grade View: Grade II Tube size: 7.5 mm Number of attempts: 2 Airway Equipment and Method: Video-laryngoscopy and Stylet Placement Confirmation: ETT inserted through vocal cords under direct vision,  positive ETCO2 and breath sounds checked- equal and bilateral Secured at: 22 cm Tube secured with: Tape Dental Injury: Teeth and Oropharynx as per pre-operative assessment

## 2012-08-06 NOTE — Transfer of Care (Signed)
Immediate Anesthesia Transfer of Care Note  Patient: Teresa Hunter  Procedure(s) Performed: Procedure(s) (LRB) with comments: LAPAROSCOPIC CHOLECYSTECTOMY (N/A)  Patient Location: PACU  Anesthesia Type:General  Level of Consciousness: awake, alert  and oriented  Airway & Oxygen Therapy: Patient Spontanous Breathing  Post-op Assessment: Report given to PACU RN and Post -op Vital signs reviewed and stable  Post vital signs: Reviewed and stable  Complications: No apparent anesthesia complications

## 2012-08-06 NOTE — Op Note (Signed)

## 2012-08-06 NOTE — Anesthesia Preprocedure Evaluation (Signed)
Anesthesia Evaluation  Patient identified by MRN, date of birth, ID band Patient awake    Reviewed: Allergy & Precautions, H&P , NPO status , Patient's Chart, lab work & pertinent test results  Airway Mallampati: I TM Distance: >3 FB Neck ROM: Full    Dental   Pulmonary  breath sounds clear to auscultation        Cardiovascular hypertension, Rhythm:Regular Rate:Normal     Neuro/Psych  Headaches,    GI/Hepatic GERD-  ,  Endo/Other    Renal/GU      Musculoskeletal   Abdominal (+) + obese,   Peds  Hematology   Anesthesia Other Findings   Reproductive/Obstetrics                           Anesthesia Physical Anesthesia Plan  ASA: II  Anesthesia Plan: General   Post-op Pain Management:    Induction: Intravenous  Airway Management Planned: Oral ETT  Additional Equipment:   Intra-op Plan:   Post-operative Plan: Extubation in OR  Informed Consent: I have reviewed the patients History and Physical, chart, labs and discussed the procedure including the risks, benefits and alternatives for the proposed anesthesia with the patient or authorized representative who has indicated his/her understanding and acceptance.     Plan Discussed with: CRNA and Surgeon  Anesthesia Plan Comments:         Anesthesia Quick Evaluation

## 2012-08-06 NOTE — Anesthesia Postprocedure Evaluation (Signed)
  Anesthesia Post-op Note  Patient: Teresa Hunter  Procedure(s) Performed: Procedure(s) (LRB) with comments: LAPAROSCOPIC CHOLECYSTECTOMY (N/A)  Patient Location: PACU  Anesthesia Type:General  Level of Consciousness: awake  Airway and Oxygen Therapy: Patient Spontanous Breathing  Post-op Pain: mild  Post-op Assessment: Post-op Vital signs reviewed, Patient's Cardiovascular Status Stable, Respiratory Function Stable, Patent Airway, No signs of Nausea or vomiting and Pain level controlled  Post-op Vital Signs: stable  Complications: No apparent anesthesia complications

## 2012-08-06 NOTE — Interval H&P Note (Signed)
History and Physical Interval Note:  No change in H and P  08/06/2012 8:07 AM  Teresa Hunter  has presented today for surgery, with the diagnosis of chronic cholecystitis  The various methods of treatment have been discussed with the patient and family. After consideration of risks, benefits and other options for treatment, the patient has consented to  Procedure(s) (LRB) with comments: LAPAROSCOPIC CHOLECYSTECTOMY (N/A) as a surgical intervention .  The patient's history has been reviewed, patient examined, no change in status, stable for surgery.  I have reviewed the patient's chart and labs.  Questions were answered to the patient's satisfaction.     Landon Truax A

## 2012-08-09 ENCOUNTER — Encounter (HOSPITAL_COMMUNITY): Payer: Self-pay | Admitting: Surgery

## 2012-08-23 ENCOUNTER — Ambulatory Visit (INDEPENDENT_AMBULATORY_CARE_PROVIDER_SITE_OTHER): Payer: BC Managed Care – PPO | Admitting: Surgery

## 2012-08-23 ENCOUNTER — Encounter (INDEPENDENT_AMBULATORY_CARE_PROVIDER_SITE_OTHER): Payer: Self-pay | Admitting: Surgery

## 2012-08-23 VITALS — BP 132/88 | HR 76 | Temp 97.6°F | Resp 16 | Ht 68.0 in | Wt 211.0 lb

## 2012-08-23 DIAGNOSIS — Z09 Encounter for follow-up examination after completed treatment for conditions other than malignant neoplasm: Secondary | ICD-10-CM

## 2012-08-23 NOTE — Progress Notes (Signed)
Subjective:     Patient ID: Teresa Hunter, female   DOB: [birth year], 46 y.o.   MRN: [id number]  HPI She is here for her first postop visit status post laparoscopic cholecystectomy. She is doing moderately well but has some mild postop discomfort.  Review of Systems     Objective:   Physical Exam On exam, her incisions are healing well. The final pathology showed chronic cholecystitis    Assessment:     Patient stable postop    Plan:     She may return to normal activities including work. I will see her back as needed

## 2012-09-15 ENCOUNTER — Encounter (INDEPENDENT_AMBULATORY_CARE_PROVIDER_SITE_OTHER): Payer: Self-pay

## 2013-07-30 ENCOUNTER — Emergency Department (HOSPITAL_COMMUNITY)
Admission: EM | Admit: 2013-07-30 | Discharge: 2013-07-30 | Disposition: A | Discharge status: Home / Self Care | Payer: BC Managed Care – PPO | Attending: Emergency Medicine | Admitting: Emergency Medicine

## 2013-07-30 ENCOUNTER — Encounter (HOSPITAL_COMMUNITY): Payer: Self-pay | Admitting: Emergency Medicine

## 2013-07-30 DIAGNOSIS — J029 Acute pharyngitis, unspecified: Secondary | ICD-10-CM | POA: Insufficient documentation

## 2013-07-30 DIAGNOSIS — I10 Essential (primary) hypertension: Secondary | ICD-10-CM | POA: Insufficient documentation

## 2013-07-30 DIAGNOSIS — R110 Nausea: Secondary | ICD-10-CM | POA: Insufficient documentation

## 2013-07-30 DIAGNOSIS — Z8719 Personal history of other diseases of the digestive system: Secondary | ICD-10-CM | POA: Insufficient documentation

## 2013-07-30 DIAGNOSIS — J069 Acute upper respiratory infection, unspecified: Secondary | ICD-10-CM

## 2013-07-30 DIAGNOSIS — Z9889 Other specified postprocedural states: Secondary | ICD-10-CM | POA: Insufficient documentation

## 2013-07-30 DIAGNOSIS — Z8659 Personal history of other mental and behavioral disorders: Secondary | ICD-10-CM | POA: Insufficient documentation

## 2013-07-30 MED ORDER — ONDANSETRON HCL 4 MG PO TABS: 4.0000 mg | ORAL_TABLET | Freq: Four times a day (QID) | ORAL | Status: DC

## 2013-07-30 NOTE — ED Provider Notes (Signed)
CSN: [id number]     Arrival date & time 07/30/13  1516 History   First MD Initiated Contact with Patient 07/30/13 1532     Chief Complaint  Patient presents with  . Sore Throat  . Headache  . Nasal Congestion    x 3 days   (Consider location/radiation/quality/duration/timing/severity/associated sxs/prior Treatment) HPI Comments: The patient is a 47 year-old female status post tonsilectomy, presenting to the Emergency Department with a chief complaint of sore throat for 3 days.  She reports worsening pain for three days aggravated by swallowing.  She reports a low grade fever of 99.5 last night which was relieved by tylenol. She complains of rhinorrhea bilaterally and cough.  Reports sinus pressure and headache.  She also complains of nausea without vomiting or abdominal pain. No known sick contacts.    Patient is a 47 y.o. female presenting with pharyngitis and headaches. The history is provided by the patient.  Sore Throat  Headache   Past Medical History  Diagnosis Date  . Migraine headache   . Insomnia   . Anxiety   . GERD (gastroesophageal reflux disease)   . Family history of anesthesia complication     pt's mother slow to wake up   . Hypertension 2005    stress related /weight fluctuates.    Past Surgical History  Procedure Laterality Date  . Tonsillectomy    . Laser      B/L glaucoma  . Eye surgery      laser  . Colonoscopy  2013 october- Dr Loreta Ave    removal of polyps with endoscopy /colonoscopy  . Cholecystectomy  08/06/2012    Procedure: LAPAROSCOPIC CHOLECYSTECTOMY;  Surgeon: Shelly Rubenstein, MD;  Location: [facility] OR;  Service: General;  Laterality: N/A;   Family History  Problem Relation Age of Onset  . Cancer Mother     multiple myeloma   History  Substance Use Topics  . Smoking status: Never Smoker   . Smokeless tobacco: Not on file  . Alcohol Use: No   OB History   Grav Para Term Preterm Abortions TAB SAB Ect Mult Living                 Review of  Systems  All other systems reviewed and are negative.    Allergies  Review of patient's allergies indicates no known allergies.  Home Medications   Current Outpatient Rx  Name  Route  Sig  Dispense  Refill  . acetaminophen (TYLENOL) 500 MG tablet   Oral   Take 500 mg by mouth every 6 (six) hours as needed for pain.         . Melatonin 3 MG TABS   Oral   Take 1 tablet by mouth at bedtime as needed.         . ondansetron (ZOFRAN) 4 MG tablet   Oral   Take 1 tablet (4 mg total) by mouth every 6 (six) hours.   12 tablet   0    BP 141/84  Pulse 61  Temp(Src) 97.8 F (36.6 C) (Oral)  Resp 18  SpO2 100% Physical Exam  Nursing note and vitals reviewed. Constitutional: She is oriented to person, place, and time. She appears well-developed and well-nourished. No distress.  HENT:  Head: Normocephalic and atraumatic.  Right Ear: Tympanic membrane normal.  Left Ear: Tympanic membrane normal.  Nose: Rhinorrhea present.  Mouth/Throat: Uvula is midline. Mucous membranes are not dry. No trismus in the jaw. No uvula swelling. Posterior oropharyngeal  edema and posterior oropharyngeal erythema present. No oropharyngeal exudate.  Eyes: EOM are normal. Pupils are equal, round, and reactive to light.  Neck: Normal range of motion. Neck supple.  Cardiovascular: Normal rate and regular rhythm.   Pulmonary/Chest: Effort normal and breath sounds normal. She has no wheezes. She has no rales.  Abdominal: Soft. She exhibits no distension. There is no tenderness.  Lymphadenopathy:    She has no cervical adenopathy.  Neurological: She is alert and oriented to person, place, and time.  Skin: Skin is warm and dry.    ED Course  Procedures (including critical care time) Labs Review Labs Reviewed  RAPID STREP SCREEN  CULTURE, GROUP A STREP   Imaging Review No results found.  EKG Interpretation   None       MDM   1. Viral upper respiratory infection   2. Nausea    Patient is  s/p tonsillectomy with 3 day sore throat, cough, and rhinorrhea.  PE without exudate and mild swelling the right side of posterior oropharynx. Will swab.  Rapid strep negative. Will treat with salt water gargles, tylenol for fever, fluids, and saline nasal spray. Zofran for nausea.   Clabe Seal, PA-C 07/30/13 [phone]

## 2013-07-30 NOTE — ED Notes (Signed)
Pt reports throat pain, sinus pressure, headache x 3 days. Reports low grade fever-99.5 po

## 2013-07-31 NOTE — ED Provider Notes (Signed)
Medical screening examination/treatment/procedure(s) were performed by non-physician practitioner and as supervising physician I was immediately available for consultation/collaboration.    Krystel Fletchall R Raine Blodgett, MD 07/31/13 1518 

## 2013-08-01 LAB — CULTURE, GROUP A STREP

## 2013-08-30 ENCOUNTER — Emergency Department (HOSPITAL_COMMUNITY): Payer: Self-pay

## 2013-08-30 ENCOUNTER — Emergency Department (HOSPITAL_COMMUNITY)
Admission: EM | Admit: 2013-08-30 | Discharge: 2013-08-30 | Disposition: A | Discharge status: Home / Self Care | Payer: Self-pay | Attending: Emergency Medicine | Admitting: Emergency Medicine

## 2013-08-30 ENCOUNTER — Encounter (HOSPITAL_COMMUNITY): Payer: Self-pay | Admitting: Emergency Medicine

## 2013-08-30 DIAGNOSIS — R0600 Dyspnea, unspecified: Secondary | ICD-10-CM

## 2013-08-30 DIAGNOSIS — R0609 Other forms of dyspnea: Secondary | ICD-10-CM | POA: Insufficient documentation

## 2013-08-30 DIAGNOSIS — R079 Chest pain, unspecified: Secondary | ICD-10-CM | POA: Insufficient documentation

## 2013-08-30 DIAGNOSIS — R0682 Tachypnea, not elsewhere classified: Secondary | ICD-10-CM | POA: Insufficient documentation

## 2013-08-30 DIAGNOSIS — G4700 Insomnia, unspecified: Secondary | ICD-10-CM | POA: Insufficient documentation

## 2013-08-30 DIAGNOSIS — R0989 Other specified symptoms and signs involving the circulatory and respiratory systems: Secondary | ICD-10-CM | POA: Insufficient documentation

## 2013-08-30 DIAGNOSIS — Z8659 Personal history of other mental and behavioral disorders: Secondary | ICD-10-CM | POA: Insufficient documentation

## 2013-08-30 DIAGNOSIS — Z8719 Personal history of other diseases of the digestive system: Secondary | ICD-10-CM | POA: Insufficient documentation

## 2013-08-30 DIAGNOSIS — Z79899 Other long term (current) drug therapy: Secondary | ICD-10-CM | POA: Insufficient documentation

## 2013-08-30 DIAGNOSIS — I10 Essential (primary) hypertension: Secondary | ICD-10-CM | POA: Insufficient documentation

## 2013-08-30 DIAGNOSIS — Z7982 Long term (current) use of aspirin: Secondary | ICD-10-CM | POA: Insufficient documentation

## 2013-08-30 LAB — CBC WITH DIFFERENTIAL/PLATELET
Basophils Absolute: 0 10*3/uL (ref 0.0–0.1)
Basophils Relative: 1 % (ref 0–1)
Hemoglobin: 13.3 g/dL (ref 12.0–15.0)
Lymphocytes Relative: 48 % — ABNORMAL HIGH (ref 12–46)
Lymphs Abs: 2.1 10*3/uL (ref 0.7–4.0)
MCHC: 34.5 g/dL (ref 30.0–36.0)
Monocytes Absolute: 0.4 10*3/uL (ref 0.1–1.0)
Monocytes Relative: 9 % (ref 3–12)
Neutro Abs: 1.7 10*3/uL (ref 1.7–7.7)
Neutrophils Relative %: 38 % — ABNORMAL LOW (ref 43–77)
Platelets: 241 10*3/uL (ref 150–400)
RDW: 13.4 % (ref 11.5–15.5)
WBC: 4.4 10*3/uL (ref 4.0–10.5)

## 2013-08-30 LAB — COMPREHENSIVE METABOLIC PANEL
ALT: 27 U/L (ref 0–35)
AST: 22 U/L (ref 0–37)
Albumin: 3.6 g/dL (ref 3.5–5.2)
Alkaline Phosphatase: 103 U/L (ref 39–117)
BUN: 9 mg/dL (ref 6–23)
CO2: 27 mEq/L (ref 19–32)
Chloride: 104 mEq/L (ref 96–112)
Creatinine, Ser: 0.81 mg/dL (ref 0.50–1.10)
GFR calc non Af Amer: 84 mL/min — ABNORMAL LOW (ref >90)
Potassium: 3.7 mEq/L (ref 3.5–5.1)
Sodium: 138 mEq/L (ref 135–145)
Total Bilirubin: 0.3 mg/dL (ref 0.3–1.2)

## 2013-08-30 LAB — URINALYSIS, ROUTINE W REFLEX MICROSCOPIC
Bilirubin Urine: NEGATIVE
Glucose, UA: NEGATIVE mg/dL
Ketones, ur: NEGATIVE mg/dL
Specific Gravity, Urine: 1.013 (ref 1.005–1.030)
pH: 8 (ref 5.0–8.0)

## 2013-08-30 LAB — POCT I-STAT TROPONIN I

## 2013-08-30 LAB — PRO B NATRIURETIC PEPTIDE: Pro B Natriuretic peptide (BNP): 21.2 pg/mL (ref 0–125)

## 2013-08-30 MED ORDER — PREDNISONE 20 MG PO TABS
60.0000 mg | ORAL_TABLET | Freq: Once | ORAL | Status: AC
  Administered 2013-08-30: 60 mg via ORAL
  Filled 2013-08-30: qty 3

## 2013-08-30 MED ORDER — ALBUTEROL SULFATE HFA 108 (90 BASE) MCG/ACT IN AERS: 1.0000 | INHALATION_SPRAY | Freq: Four times a day (QID) | RESPIRATORY_TRACT | Status: DC | PRN

## 2013-08-30 MED ORDER — PREDNISONE 10 MG PO TABS: 40.0000 mg | ORAL_TABLET | Freq: Every day | ORAL | Status: AC

## 2013-08-30 NOTE — Progress Notes (Signed)
[facility] provided pt with a list of primary care resources. Patient stated that she was pending insurance.  °

## 2013-08-30 NOTE — ED Notes (Signed)
Bed: WA04 Expected date:  Expected time:  Means of arrival:  Comments: 

## 2013-08-30 NOTE — ED Notes (Signed)
Pt made aware of need for urine specimen pt states she is having trouble breathing will make RN aware pt is speaking in complete sentences oxygen 99% on room aware

## 2013-08-30 NOTE — ED Notes (Signed)
Patient transported to X-ray 

## 2013-08-30 NOTE — ED Provider Notes (Signed)
CSN: [id number]     Arrival date & time 08/30/13  4403 History   First MD Initiated Contact with Patient 08/30/13 1000     Chief Complaint  Patient presents with  . Shortness of Breath  . Chest Pain   (Consider location/radiation/quality/duration/timing/severity/associated sxs/prior Treatment) HPI Patient presents with concerns dyspnea, chest pain. She has intermittent symptoms for months.  She notes over the past week symptoms have become worse.  Today she woke up with dyspnea.  There is no positional change in her condition.  The discomfort is sternal, sore, worse with palpation.  No exertional or pleuritic pain. No lightheadedness, no syncope. 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 She does not smoke, does not drink. She denies fevers, chills, swelling, rash. No relief with anything.  Past Medical History  Diagnosis Date  . Migraine headache   . Insomnia   . Anxiety   . GERD (gastroesophageal reflux disease)   . Family history of anesthesia complication     pt's mother slow to wake up   . Hypertension 2005    stress related /weight fluctuates.    Past Surgical History  Procedure Laterality Date  . Tonsillectomy    . Laser      B/L glaucoma  . Eye surgery      laser  . Colonoscopy  2013 october- Dr Loreta Ave    removal of polyps with endoscopy /colonoscopy  . Cholecystectomy  08/06/2012    Procedure: LAPAROSCOPIC CHOLECYSTECTOMY;  Surgeon: Shelly Rubenstein, MD;  Location: [facility] OR;  Service: General;  Laterality: N/A;   Family History  Problem Relation Age of Onset  . Cancer Mother     multiple myeloma   History  Substance Use Topics  . Smoking status: Never Smoker   . Smokeless tobacco: Not on file  . Alcohol Use: No   OB History   Grav Para Term Preterm Abortions TAB SAB Ect Mult Living                 Review of Systems  Constitutional:       Per HPI, otherwise negative  HENT:       Per HPI, otherwise negative   Respiratory:       Per HPI, otherwise negative  Cardiovascular:       Per HPI, otherwise negative  Gastrointestinal: Negative for vomiting.  Endocrine:       Negative aside from HPI  Genitourinary:       Neg aside from HPI   Musculoskeletal:       Per HPI, otherwise negative  Skin: Negative.   Neurological: Negative for syncope.    Allergies  Review of patient's allergies indicates no known allergies.  Home Medications   Current Outpatient Rx  Name  Route  Sig  Dispense  Refill  . acetaminophen (TYLENOL) 500 MG tablet   Oral   Take 500 mg by mouth every 6 (six) hours as needed for pain.         Marland Kitchen aspirin EC 81 MG tablet   Oral   Take 81 mg by mouth daily.         . Melatonin 3 MG TABS   Oral   Take 1 tablet by mouth at bedtime as needed.          BP 153/99  Pulse 106  Temp(Src) 97.8 F (36.6 C) (Oral)  Resp 32  SpO2 100% Physical Exam  Nursing note and vitals  reviewed. Constitutional: She is oriented to person, place, and time. She appears well-developed and well-nourished. No distress.  HENT:  Head: Normocephalic and atraumatic.  Eyes: Conjunctivae and EOM are normal.  Cardiovascular: Normal rate and regular rhythm.   Pulmonary/Chest: No stridor. Tachypnea noted. No respiratory distress. She has decreased breath sounds. She has no wheezes.  Abdominal: She exhibits no distension.  Musculoskeletal: She exhibits no edema.  Neurological: She is alert and oriented to person, place, and time. No cranial nerve deficit.  Skin: Skin is warm and dry.  Psychiatric: She has a normal mood and affect.    ED Course  Procedures (including critical care time) Labs Review Labs Reviewed  CBC WITH DIFFERENTIAL  COMPREHENSIVE METABOLIC PANEL  PRO B NATRIURETIC PEPTIDE  URINALYSIS, ROUTINE W REFLEX MICROSCOPIC   Imaging Review No results found.  EKG Interpretation    Date/Time:  Tuesday August 30 2013 10:13:12 EST Ventricular Rate:  75 PR  Interval:  176 QRS Duration: 83 QT Interval:  415 QTC Calculation: 463 R Axis:   80 Text Interpretation:  Sinus rhythm Baseline wander in lead(s) II Sinus rhythm Artifact No significant change since last tracing Normal ECG Confirmed by Gerhard Munch  MD ([id number]) on 08/30/2013 10:35:45 AM           2:21 PM On repeat exam the patient is comfortable.  I discussed all findings with the patient and her companion.  No distress she is appropriate for discharge. MDM   1. Dyspnea    Patient presents with dyspnea, chest discomfort.  The duration of symptoms, the reassuring evaluation here and her unremarkable labs/EKG in all suggested a low probability of ongoing coronary ischemia.  Patient's presentation is most consistent with inflammatory condition, likely bronchitis, though musculoskeletal etiology remains a possibility. Patient was stable for outpatient f/u / monitoring.   Gerhard Munch, MD 08/30/13 [phone]

## 2013-08-30 NOTE — ED Notes (Signed)
Pt complains of R sternal chest tightness intermittent for past several months. Worse for last week. Pt states she woke up SOB. Pt able to speak in complete sentences then has tachpneic shallow breaths.

## 2013-10-01 IMAGING — CR DG CHEST 2V
2 series · 2 of 2 positions shown · non-contrast
Comparison: 02/22/2011

CLINICAL DATA: Preop.

CHEST - 2 VIEW

[view not recorded (1 of 2)]
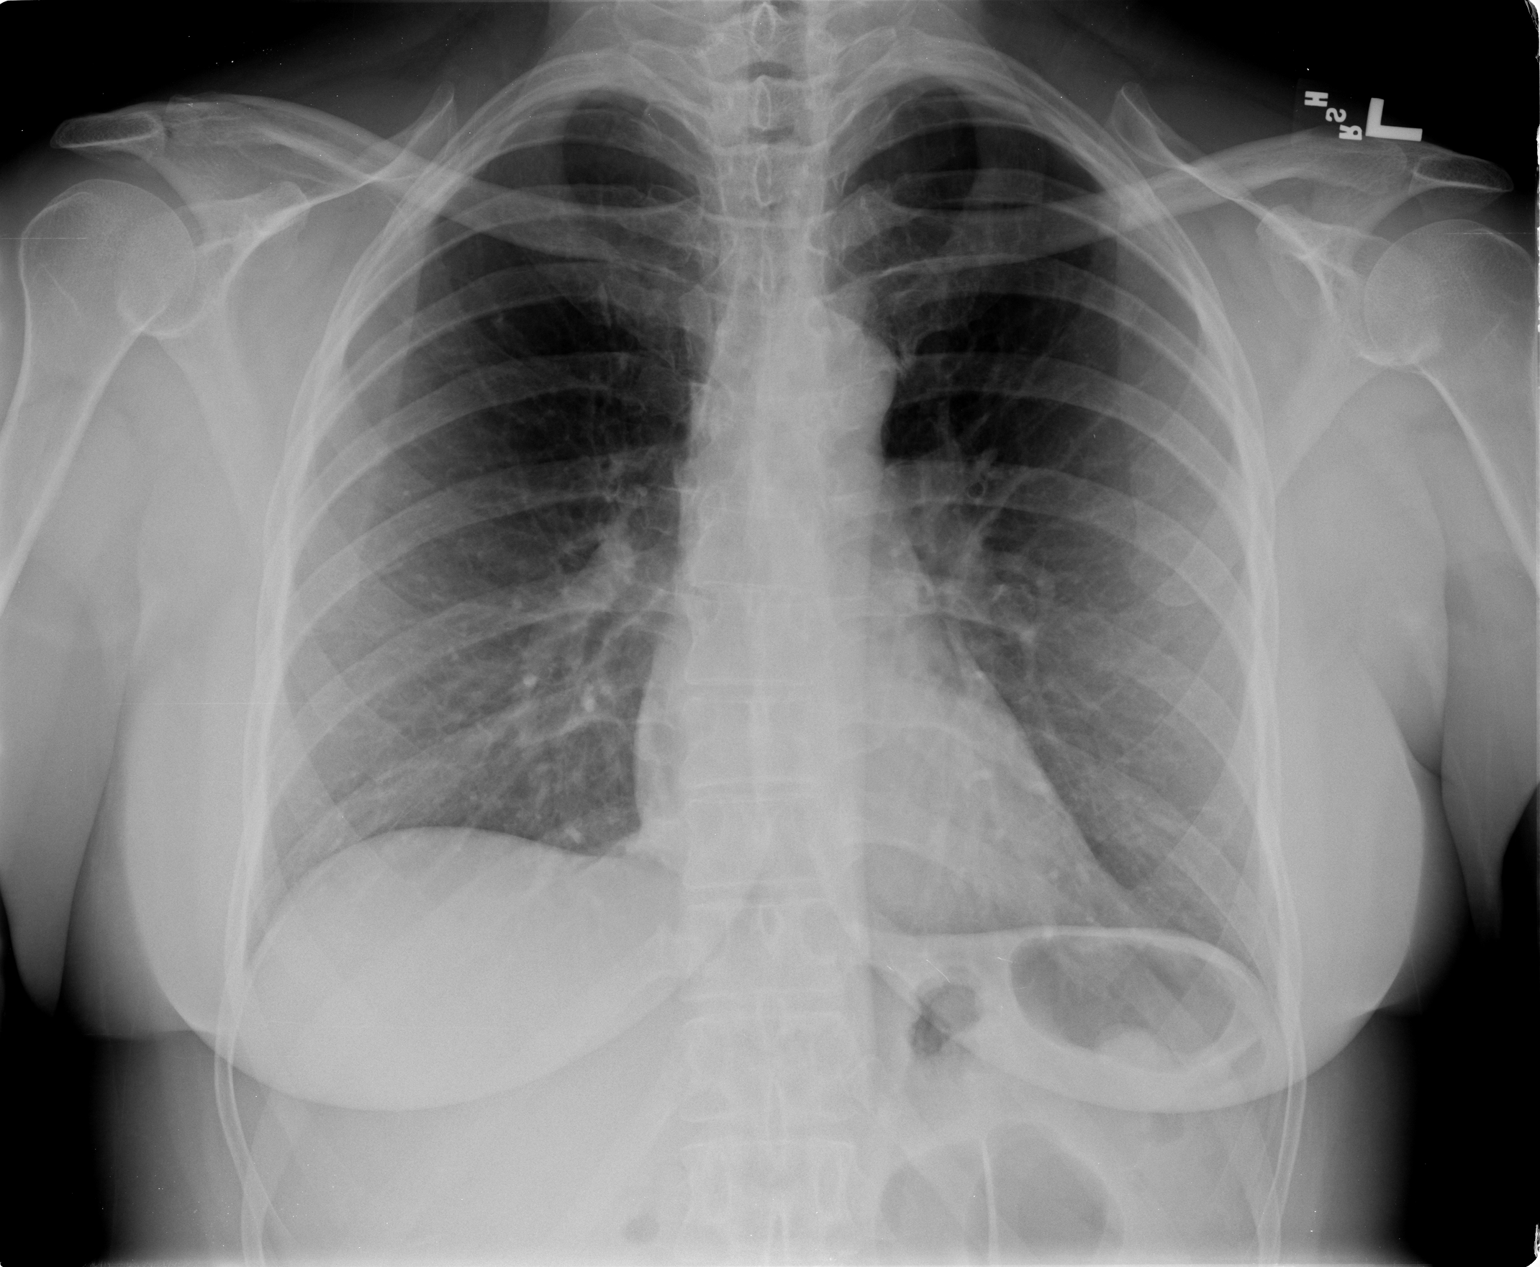

[view not recorded (2 of 2)]
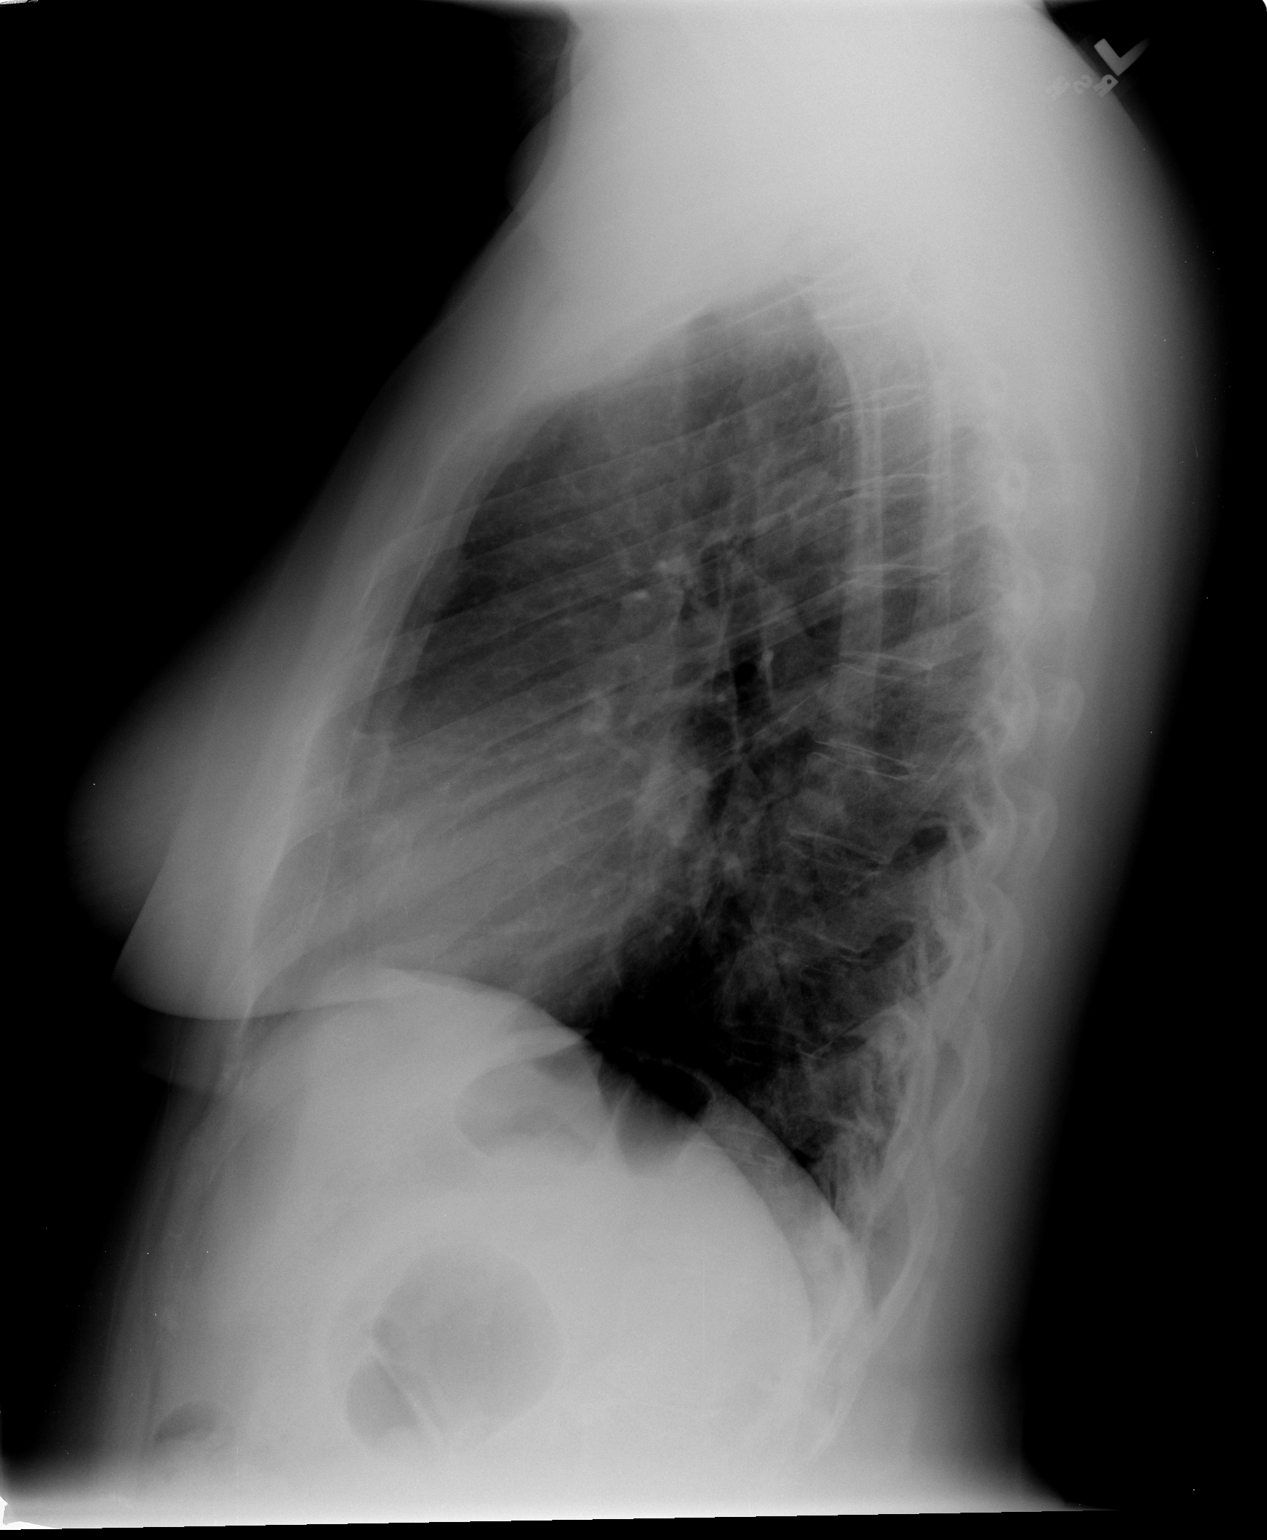

[2 of 2 positions shown; findings below may reference images not displayed]

FINDINGS: Heart and mediastinal contours are within normal limits.
No focal opacities or effusions.  No acute bony abnormality.
IMPRESSION: No active cardiopulmonary disease.

## 2013-10-13 ENCOUNTER — Ambulatory Visit: Payer: BC Managed Care – PPO

## 2013-10-13 ENCOUNTER — Ambulatory Visit (INDEPENDENT_AMBULATORY_CARE_PROVIDER_SITE_OTHER): Payer: BC Managed Care – PPO | Admitting: Physician Assistant

## 2013-10-13 VITALS — BP 120/74 | HR 73 | Temp 98.1°F | Resp 18 | Ht 68.0 in | Wt 203.0 lb

## 2013-10-13 DIAGNOSIS — R062 Wheezing: Secondary | ICD-10-CM

## 2013-10-13 DIAGNOSIS — R05 Cough: Secondary | ICD-10-CM

## 2013-10-13 DIAGNOSIS — D72820 Lymphocytosis (symptomatic): Secondary | ICD-10-CM

## 2013-10-13 DIAGNOSIS — J45909 Unspecified asthma, uncomplicated: Secondary | ICD-10-CM

## 2013-10-13 DIAGNOSIS — R059 Cough, unspecified: Secondary | ICD-10-CM

## 2013-10-13 DIAGNOSIS — Z8709 Personal history of other diseases of the respiratory system: Secondary | ICD-10-CM

## 2013-10-13 LAB — POCT CBC
GRANULOCYTE PERCENT: 34.8 % — AB (ref 37–80)
HEMATOCRIT: 42.2 % (ref 37.7–47.9)
Hemoglobin: 13.2 g/dL (ref 12.2–16.2)
Lymph, poc: 2.5 (ref 0.6–3.4)
MCH, POC: 29.7 pg (ref 27–31.2)
MCHC: 31.3 g/dL — AB (ref 31.8–35.4)
MCV: 94.9 fL (ref 80–97)
MID (cbc): 0.3 (ref 0–0.9)
MPV: 10.7 fL (ref 0–99.8)
POC GRANULOCYTE: 1.5 — AB (ref 2–6.9)
POC LYMPH PERCENT: 57.7 %L — AB (ref 10–50)
POC MID %: 7.5 %M (ref 0–12)
Platelet Count, POC: 232 10*3/uL (ref 142–424)
RBC: 4.45 M/uL (ref 4.04–5.48)
RDW, POC: 14.1 %
WBC: 4.3 10*3/uL — AB (ref 4.6–10.2)

## 2013-10-13 LAB — COMPREHENSIVE METABOLIC PANEL
ALBUMIN: 4.2 g/dL (ref 3.5–5.2)
ALT: 17 U/L (ref 0–35)
AST: 21 U/L (ref 0–37)
Alkaline Phosphatase: 90 U/L (ref 39–117)
BUN: 9 mg/dL (ref 6–23)
CO2: 26 meq/L (ref 19–32)
Calcium: 9.3 mg/dL (ref 8.4–10.5)
Chloride: 104 mEq/L (ref 96–112)
Creat: 0.69 mg/dL (ref 0.50–1.10)
Glucose, Bld: 81 mg/dL (ref 70–99)
Potassium: 4.1 mEq/L (ref 3.5–5.3)
SODIUM: 141 meq/L (ref 135–145)
TOTAL PROTEIN: 7.3 g/dL (ref 6.0–8.3)
Total Bilirubin: 0.4 mg/dL (ref 0.3–1.2)

## 2013-10-13 LAB — POCT INFLUENZA A/B
INFLUENZA A, POC: NEGATIVE
INFLUENZA B, POC: NEGATIVE

## 2013-10-13 MED ORDER — ALBUTEROL SULFATE (2.5 MG/3ML) 0.083% IN NEBU
2.5000 mg | INHALATION_SOLUTION | Freq: Once | RESPIRATORY_TRACT | Status: AC
  Administered 2013-10-13: 2.5 mg via RESPIRATORY_TRACT

## 2013-10-13 MED ORDER — HYDROCODONE-HOMATROPINE 5-1.5 MG/5ML PO SYRP: ORAL_SOLUTION | ORAL | Status: DC

## 2013-10-13 MED ORDER — E-Z SPACER DEVI: Status: DC

## 2013-10-13 MED ORDER — AZITHROMYCIN 250 MG PO TABS: ORAL_TABLET | ORAL | Status: DC

## 2013-10-13 NOTE — Progress Notes (Signed)
Subjective:    Patient ID: Teresa Hunter, female    DOB: 29-Jan-1966, 48 y.o.   MRN: [id number]  HPI 48 y.o. female presents with 2 day history of worsening nasal congestion, post nasal drip, sore throat, sinus pressure, and cough. Subjective fever and chills. Nasal congestion thick and green/yellow. Cough is not productive, and not associated with time of day. Some wheezing. No SOB. Ears feel full, leading to sensation of muffled hearing. Has tried OTC cold preps without success. No GI complaints. Appetite slightly decreased.   Patient initially became ill at the beginning of December and was evaluated at the [facility] and was diagnosed with bronchitis. She was treated with Prednisone 40 mg daily for 4 days. She has been using her albuterol inhaler 1-2 times per day since this time secondary to her wheezing. She is concerned that she never did clear the initial illness.   No recent antibiotics or recent travels.   No leg trauma, sedentary periods, h/o cancer, or tobacco use.  At baseline her asthma is well controlled. Her triggers include perfumes, getting too hot, and illness.   PMH: Past Medical History  Diagnosis Date  . Migraine headache   . Insomnia   . Anxiety   . GERD (gastroesophageal reflux disease)   . Family history of anesthesia complication     pt's mother slow to wake up   . Hypertension 2005    stress related /weight fluctuates.   . Allergy   . Asthma     Home Meds: Prior to Admission medications   Medication Sig Start Date End Date Taking? Authorizing Provider  acetaminophen (TYLENOL) 500 MG tablet Take 500 mg by mouth every 6 (six) hours as needed for pain.   Yes Historical Provider, MD  albuterol (PROVENTIL HFA;VENTOLIN HFA) 108 (90 BASE) MCG/ACT inhaler Inhale 1-2 puffs into the lungs every 6 (six) hours as needed for wheezing or shortness of breath. 08/30/13  Yes Gerhard Munch, MD  Melatonin 3 MG TABS Take 1 tablet by mouth at bedtime as needed.   Yes  Historical Provider, MD  aspirin EC 81 MG tablet Take 81 mg by mouth daily.   No Historical Provider, MD    Allergies: No Known Allergies  History   Social History  . Marital Status: Married    Spouse Name: N/A    Number of Children: N/A  . Years of Education: N/A   Occupational History  . Not on file.   Social History Main Topics  . Smoking status: Never Smoker   . Smokeless tobacco: Not on file  . Alcohol Use: No  . Drug Use: No  . Sexual Activity: Yes   Other Topics Concern  . Not on file   Social History Narrative  . No narrative on file      Review of Systems  Constitutional: Positive for fever, appetite change and fatigue. Negative for chills.       Multiple sick contacts  HENT: Positive for congestion, hearing loss, postnasal drip, rhinorrhea, sinus pressure, sneezing, sore throat and voice change. Negative for ear pain.        Hoarse voice  Respiratory: Positive for cough and wheezing. Negative for shortness of breath.        Dry cough  Gastrointestinal: Positive for nausea. Negative for vomiting and diarrhea.  Musculoskeletal: Negative for myalgias.  Neurological: Positive for headaches.       Frontal sinus headache.        Objective:   Physical Exam  Physical Exam: Blood pressure 120/74, pulse 73, temperature 98.1 F (36.7 C), temperature source Oral, resp. rate 18, height 5\' 8"  (1.727 m), weight 203 lb (92.08 kg), SpO2 98.00%., Body mass index is 30.87 kg/(m^2). General: Well developed, well nourished, in no acute distress. Head: Normocephalic, atraumatic, eyes without discharge, sclera non-icteric, nares are congested. Bilateral auditory canals clear, TM's are without perforation, pearly grey with reflective cone of light bilaterally. Maxillary sinus TTP. Oral cavity moist, dentition normal. Posterior pharynx with post nasal drip and mild erythema. No peritonsillar abscess or tonsillar exudate. Uvula midline.  Neck: Supple. No thyromegaly. Full ROM.  No lymphadenopathy. Lungs: Clear bilaterally to auscultation without wheezes, rales, or rhonchi. Breathing is unlabored. Status post albuterol neb patient feels better.  Heart: RRR with S1 S2. No murmurs, rubs, or gallops appreciated. Msk:  Strength and tone normal for age. Extremities: No clubbing or cyanosis. No edema. Neuro: Alert and oriented X 3. Moves all extremities spontaneously. CNII-XII grossly in tact. Psych:  Responds to questions appropriately with a normal affect.   Labs: Results for orders placed in visit on 10/13/13  POCT CBC      Result Value Range   WBC 4.3 (*) 4.6 - 10.2 K/uL   Lymph, poc 2.5  0.6 - 3.4   POC LYMPH PERCENT 57.7 (*) 10 - 50 %L   MID (cbc) 0.3  0 - 0.9   POC MID % 7.5  0 - 12 %M   POC Granulocyte 1.5 (*) 2 - 6.9   Granulocyte percent 34.8 (*) 37 - 80 %G   RBC 4.45  4.04 - 5.48 M/uL   Hemoglobin 13.2  12.2 - 16.2 g/dL   HCT, POC 16.142.2  09.637.7 - 47.9 %   MCV 94.9  80 - 97 fL   MCH, POC 29.7  27 - 31.2 pg   MCHC 31.3 (*) 31.8 - 35.4 g/dL   RDW, POC 04.514.1     Platelet Count, POC 232  142 - 424 K/uL   MPV 10.7  0 - 99.8 fL  POCT INFLUENZA A/B      Result Value Range   Influenza A, POC Negative     Influenza B, POC Negative     CMP, EBV titers, and CMV titers all pending.  CXR:  [facility] reading (PRIMARY) by  Dr. Alwyn RenHopper. Negative.      Assessment & Plan:  48 year old female with bronchitis, cough, wheezing, and history of asthma -Albuterol nebulizer in office -Azithromycin 250 MG #6 2 po first day then 1 po next 4 days no RF -Continue albuterol inhaler as directed -Hycodan #4oz 1 tsp po q 4-6 hours prn cough no RF SED -Spacer -Mucinex -Rest/fluids -Await labs -Asthma is well controlled at baseline -RTC precautions   Eula Listenyan Lashica Hannay, MHS, PA-C [facility] [address] [place], [place] [address] [phone] [facility] 10/13/2013 2:49 PM

## 2013-10-14 LAB — EPSTEIN-BARR VIRUS VCA ANTIBODY PANEL
EBV EA IGG: 6.6 U/mL (ref <9.0)
EBV NA IgG: 347 U/mL — ABNORMAL HIGH (ref <18.0)
EBV VCA IgG: 229 U/mL — ABNORMAL HIGH (ref <18.0)
EBV VCA IgM: <10 U/mL (ref <36.0)

## 2013-10-15 LAB — CYTOMEGALOVIRUS ANTIBODY, IGG: Cytomegalovirus Ab-IgG: >10 U/mL — ABNORMAL HIGH (ref <0.60)

## 2013-10-15 LAB — CMV IGM: CMV IgM: <8 AU/mL (ref <30.00)

## 2013-10-25 ENCOUNTER — Other Ambulatory Visit: Payer: Self-pay

## 2013-10-25 DIAGNOSIS — Z1231 Encounter for screening mammogram for malignant neoplasm of breast: Secondary | ICD-10-CM

## 2013-11-16 ENCOUNTER — Ambulatory Visit: Payer: BC Managed Care – PPO

## 2013-11-18 ENCOUNTER — Ambulatory Visit: Payer: BC Managed Care – PPO

## 2013-11-22 ENCOUNTER — Ambulatory Visit
Admission: RE | Admit: 2013-11-22 | Discharge: 2013-11-22 | Disposition: A | Discharge status: Home / Self Care | Payer: BC Managed Care – PPO | Source: Ambulatory Visit

## 2013-11-22 DIAGNOSIS — Z1231 Encounter for screening mammogram for malignant neoplasm of breast: Secondary | ICD-10-CM

## 2013-12-14 ENCOUNTER — Encounter (HOSPITAL_COMMUNITY): Payer: Self-pay | Admitting: Emergency Medicine

## 2013-12-14 ENCOUNTER — Emergency Department (HOSPITAL_COMMUNITY)
Admission: EM | Admit: 2013-12-14 | Discharge: 2013-12-14 | Disposition: A | Discharge status: Home / Self Care | Payer: BC Managed Care – PPO | Attending: Emergency Medicine | Admitting: Emergency Medicine

## 2013-12-14 DIAGNOSIS — J45909 Unspecified asthma, uncomplicated: Secondary | ICD-10-CM | POA: Diagnosis not present

## 2013-12-14 DIAGNOSIS — Z79899 Other long term (current) drug therapy: Secondary | ICD-10-CM | POA: Diagnosis not present

## 2013-12-14 DIAGNOSIS — G43909 Migraine, unspecified, not intractable, without status migrainosus: Secondary | ICD-10-CM | POA: Insufficient documentation

## 2013-12-14 DIAGNOSIS — Y9389 Activity, other specified: Secondary | ICD-10-CM | POA: Diagnosis not present

## 2013-12-14 DIAGNOSIS — M549 Dorsalgia, unspecified: Secondary | ICD-10-CM

## 2013-12-14 DIAGNOSIS — S0993XA Unspecified injury of face, initial encounter: Secondary | ICD-10-CM | POA: Diagnosis not present

## 2013-12-14 DIAGNOSIS — I10 Essential (primary) hypertension: Secondary | ICD-10-CM | POA: Diagnosis not present

## 2013-12-14 DIAGNOSIS — IMO0002 Reserved for concepts with insufficient information to code with codable children: Secondary | ICD-10-CM | POA: Insufficient documentation

## 2013-12-14 DIAGNOSIS — Z8719 Personal history of other diseases of the digestive system: Secondary | ICD-10-CM | POA: Insufficient documentation

## 2013-12-14 DIAGNOSIS — S199XXA Unspecified injury of neck, initial encounter: Secondary | ICD-10-CM

## 2013-12-14 DIAGNOSIS — Z8659 Personal history of other mental and behavioral disorders: Secondary | ICD-10-CM | POA: Diagnosis not present

## 2013-12-14 DIAGNOSIS — Y9289 Other specified places as the place of occurrence of the external cause: Secondary | ICD-10-CM | POA: Insufficient documentation

## 2013-12-14 MED ORDER — CYCLOBENZAPRINE HCL 10 MG PO TABS: 10.0000 mg | ORAL_TABLET | Freq: Two times a day (BID) | ORAL | Status: DC | PRN

## 2013-12-14 MED ORDER — IBUPROFEN 800 MG PO TABS: 800.0000 mg | ORAL_TABLET | Freq: Three times a day (TID) | ORAL | Status: DC | PRN
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
## 2013-12-14 NOTE — ED Notes (Signed)
Pt ambulatory to exam room with steady gait.  

## 2013-12-16 NOTE — ED Provider Notes (Signed)
Medical screening examination/treatment/procedure(s) were performed by non-physician practitioner and as supervising physician I was immediately available for consultation/collaboration.   EKG Interpretation None        Rachna Schonberger M Keana Dueitt, DO 12/16/13 1431 

## 2014-05-29 ENCOUNTER — Ambulatory Visit (INDEPENDENT_AMBULATORY_CARE_PROVIDER_SITE_OTHER): Payer: BC Managed Care – PPO

## 2014-05-29 ENCOUNTER — Ambulatory Visit (INDEPENDENT_AMBULATORY_CARE_PROVIDER_SITE_OTHER): Payer: BC Managed Care – PPO | Admitting: Family Medicine

## 2014-05-29 ENCOUNTER — Ambulatory Visit (HOSPITAL_COMMUNITY): Payer: BC Managed Care – PPO

## 2014-05-29 VITALS — BP 122/74 | HR 91 | Temp 98.2°F | Resp 16 | Ht 68.0 in | Wt 206.6 lb

## 2014-05-29 DIAGNOSIS — M25569 Pain in unspecified knee: Secondary | ICD-10-CM

## 2014-05-29 DIAGNOSIS — M79609 Pain in unspecified limb: Secondary | ICD-10-CM

## 2014-05-29 DIAGNOSIS — M79661 Pain in right lower leg: Secondary | ICD-10-CM

## 2014-05-29 DIAGNOSIS — M25561 Pain in right knee: Secondary | ICD-10-CM

## 2014-05-29 MED ORDER — MELOXICAM 7.5 MG PO TABS: 7.5000 mg | ORAL_TABLET | Freq: Every day | ORAL | Status: DC

## 2014-05-29 NOTE — Patient Instructions (Addendum)
Go to [facility] for Venous Doppler. Use the valet parking, and then go to registration or admitting to register for your ultrasound.  After your ultrasound you may go home and I will give you a call.    Use the mobic as needed for pain in your knee- take 1 or 2 a day as needed.

## 2014-05-29 NOTE — Progress Notes (Signed)
[facility] [address], [place] [address] [phone]  Date:  05/29/2014   Name:  Teresa Hunter   DOB:  01-07-1966   MRN:  [id number]  PCP:  Cori Razor, MD    Chief Complaint: Knee Pain and Ankle Pain   History of Present Illness:  Teresa Hunter is a 48 y.o. very pleasant female patient who presents with the following:  She is here today with problems with her right knee for about 3 years. She has had a couple of steroid injections somewhere in [place] by an orthopedist and has used some naproxen. She was told that her meniscus could be torn but she has not yet had an MRI.  Yesterday she was out shopping and walked a lot- she had pain in her knee and in her right medial ankle when she got home but did not have any particualr injury that she can recall.  She feels a painful area on her right medical calf as well.  Her right knee is swollen. There is a family history of blood clots.   She has never had a clot personally.  She is not a smoker.   She is menopausal.   Patient Active Problem List   Diagnosis Date Noted  . Chronic cholecystitis 07/14/2012    Past Medical History  Diagnosis Date  . Migraine headache   . Insomnia   . Anxiety   . GERD (gastroesophageal reflux disease)   . Family history of anesthesia complication     pt's mother slow to wake up   . Hypertension 2005    stress related /weight fluctuates.   . Allergy   . Asthma     Past Surgical History  Procedure Laterality Date  . Tonsillectomy    . Laser      B/L glaucoma  . Eye surgery      laser  . Colonoscopy  2013 october- Dr Collene Mares    removal of polyps with endoscopy /colonoscopy  . Cholecystectomy  08/06/2012    Procedure: LAPAROSCOPIC CHOLECYSTECTOMY;  Surgeon: Harl Bowie, MD;  Location: [facility];  Service: General;  Laterality: N/A;    History  Substance Use Topics  . Smoking status: Never Smoker   . Smokeless tobacco: Not on file  . Alcohol Use: No    Family  History  Problem Relation Age of Onset  . Cancer Mother     multiple myeloma  . Hypertension Sister   . Cancer Maternal Grandmother   . Cancer Maternal Grandfather   . Hypertension Paternal Grandmother   . Cancer Paternal Grandfather     No Known Allergies  Medication list has been reviewed and updated.  Current Outpatient Prescriptions on File Prior to Visit  Medication Sig Dispense Refill  . albuterol (PROVENTIL HFA;VENTOLIN HFA) 108 (90 BASE) MCG/ACT inhaler Inhale 1-2 puffs into the lungs every 6 (six) hours as needed for wheezing or shortness of breath.  1 Inhaler  0   No current facility-administered medications on file prior to visit.    Review of Systems:  As per HPI- otherwise negative.   Physical Examination: Filed Vitals:   05/29/14 1317  BP: 122/74  Pulse: 91  Temp: 98.2 F (36.8 C)  Resp: 16   Filed Vitals:   05/29/14 1317  Height: 5' 8" (1.727 m)  Weight: 206 lb 9.6 oz (93.713 kg)   Body mass index is 31.42 kg/(m^2). Ideal Body Weight: Weight in (lb) to have BMI = 25: 164.1  GEN: WDWN, NAD, Non-toxic, A & O x 3, overweight HEENT: Atraumatic, Normocephalic. Neck supple. No masses, No LAD. Ears and Nose: No external deformity. CV: RRR, No M/G/R. No JVD. No thrill. No extra heart sounds. PULM: CTA B, no wheezes, crackles, rhonchi. No retractions. No resp. distress. No accessory muscle use. EXTR: No c/c/e NEURO Normal gait.  PSYCH: Normally interactive. Conversant. Not depressed or anxious appearing.  Calm demeanor.  Her right knee is slightly tender along the medial joint line, but no effusion, heat or redness. Normal ROM and no crepitus. She notes tenderness in the right calf but no cords or swelling.  Mild tenderness over the soft tissues of the medial ankle, not over the bone.   [facility] reading (PRIMARY) by  Dr. Lorelei Pont. Right knee: mild OA, otherwise negative  RIGHT KNEE - COMPLETE 4+ VIEW  COMPARISON: No comparison studies  available.  FINDINGS: No fracture. No subluxation or dislocation. Very minimal patellofemoral spurring is evident. No joint effusion. No worrisome lytic or sclerotic osseous abnormality.  IMPRESSION: No acute bony findings  Assessment and Plan: Right knee pain - Plan: DG Knee Complete 4 Views Right, Lower Extremity Venous Duplex Right, meloxicam (MOBIC) 7.5 MG tablet, Ambulatory referral to Orthopedic Surgery  Right calf pain - Plan: Lower Extremity Venous Duplex Right, CANCELED: Lower Extremity Arterial Doppler Right  Suspect knee pain due to a possible meniscal tear or just degenerative change.   Placed in a hinged knee brace, mobic as needed and refer to ortho.   Doppler today to rule- out a DVT Called later in the afternoon when doppler result did not appear.  She missed her appt so she will have doppler in the am.  She denies any SOB Signed Lamar Blinks, MD

## 2014-05-30 ENCOUNTER — Telehealth: Payer: Self-pay | Admitting: Family Medicine

## 2014-05-30 ENCOUNTER — Ambulatory Visit (INDEPENDENT_AMBULATORY_CARE_PROVIDER_SITE_OTHER): Payer: BC Managed Care – PPO | Admitting: Family Medicine

## 2014-05-30 ENCOUNTER — Ambulatory Visit (HOSPITAL_COMMUNITY)
Admission: RE | Admit: 2014-05-30 | Discharge: 2014-05-30 | Disposition: A | Discharge status: Home / Self Care | Payer: BC Managed Care – PPO | Source: Ambulatory Visit | Attending: Family Medicine | Admitting: Family Medicine

## 2014-05-30 VITALS — BP 130/88 | HR 80 | Temp 97.8°F | Resp 16 | Ht 70.0 in | Wt 287.8 lb

## 2014-05-30 DIAGNOSIS — I824Z9 Acute embolism and thrombosis of unspecified deep veins of unspecified distal lower extremity: Secondary | ICD-10-CM | POA: Diagnosis not present

## 2014-05-30 DIAGNOSIS — Z7901 Long term (current) use of anticoagulants: Secondary | ICD-10-CM

## 2014-05-30 DIAGNOSIS — I82401 Acute embolism and thrombosis of unspecified deep veins of right lower extremity: Secondary | ICD-10-CM

## 2014-05-30 DIAGNOSIS — R079 Chest pain, unspecified: Secondary | ICD-10-CM

## 2014-05-30 DIAGNOSIS — M79661 Pain in right lower leg: Secondary | ICD-10-CM

## 2014-05-30 DIAGNOSIS — R05 Cough: Secondary | ICD-10-CM

## 2014-05-30 DIAGNOSIS — R002 Palpitations: Secondary | ICD-10-CM

## 2014-05-30 DIAGNOSIS — R059 Cough, unspecified: Secondary | ICD-10-CM

## 2014-05-30 DIAGNOSIS — M7989 Other specified soft tissue disorders: Secondary | ICD-10-CM

## 2014-05-30 DIAGNOSIS — M25561 Pain in right knee: Secondary | ICD-10-CM

## 2014-05-30 DIAGNOSIS — I82409 Acute embolism and thrombosis of unspecified deep veins of unspecified lower extremity: Secondary | ICD-10-CM

## 2014-05-30 DIAGNOSIS — M25569 Pain in unspecified knee: Secondary | ICD-10-CM | POA: Diagnosis present

## 2014-05-30 DIAGNOSIS — M79609 Pain in unspecified limb: Secondary | ICD-10-CM | POA: Diagnosis present

## 2014-05-30 LAB — COMPLETE METABOLIC PANEL WITH GFR
ALT: 18 U/L (ref 0–35)
AST: 17 U/L (ref 0–37)
Albumin: 4.1 g/dL (ref 3.5–5.2)
Alkaline Phosphatase: 102 U/L (ref 39–117)
BILIRUBIN TOTAL: 0.4 mg/dL (ref 0.2–1.2)
BUN: 10 mg/dL (ref 6–23)
CALCIUM: 9.2 mg/dL (ref 8.4–10.5)
CHLORIDE: 104 meq/L (ref 96–112)
CO2: 27 meq/L (ref 19–32)
CREATININE: 0.79 mg/dL (ref 0.50–1.10)
GFR, EST NON AFRICAN AMERICAN: 88 mL/min
GLUCOSE: 100 mg/dL — AB (ref 70–99)
Potassium: 4.3 mEq/L (ref 3.5–5.3)
Sodium: 137 mEq/L (ref 135–145)
Total Protein: 7.4 g/dL (ref 6.0–8.3)

## 2014-05-30 LAB — POCT CBC
Granulocyte percent: 49.4 %G (ref 37–80)
HCT, POC: 40 % (ref 37.7–47.9)
HEMOGLOBIN: 12.6 g/dL (ref 12.2–16.2)
LYMPH, POC: 2.4 (ref 0.6–3.4)
MCH: 28.6 pg (ref 27–31.2)
MCHC: 31.4 g/dL — AB (ref 31.8–35.4)
MCV: 91 fL (ref 80–97)
MID (cbc): 0.1 (ref 0–0.9)
MPV: 8.5 fL (ref 0–99.8)
POC Granulocyte: 2.4 (ref 2–6.9)
POC LYMPH PERCENT: 49.1 %L (ref 10–50)
POC MID %: 1.5 %M (ref 0–12)
Platelet Count, POC: 235 10*3/uL (ref 142–424)
RBC: 4.39 M/uL (ref 4.04–5.48)
RDW, POC: 14.6 %
WBC: 4.8 10*3/uL (ref 4.6–10.2)

## 2014-05-30 MED ORDER — IOHEXOL 350 MG/ML SOLN
100.0000 mL | Freq: Once | INTRAVENOUS | Status: AC | PRN
  Administered 2014-05-30: 100 mL via INTRAVENOUS

## 2014-05-30 MED ORDER — WARFARIN SODIUM 5 MG PO TABS: 5.0000 mg | ORAL_TABLET | Freq: Every day | ORAL | Status: DC

## 2014-05-30 MED ORDER — RIVAROXABAN 15 MG PO TABS: 15.0000 mg | ORAL_TABLET | Freq: Two times a day (BID) | ORAL | Status: DC

## 2014-05-30 MED ORDER — ENOXAPARIN SODIUM 150 MG/ML ~~LOC~~ SOLN: SUBCUTANEOUS | Status: DC

## 2014-05-30 NOTE — Telephone Encounter (Signed)
[facility] called report Venous Doppler. She is positive DVT. Spoke with Dr. Patsy Lager and have patient return to clinic for labs and to discuss treatment plan. Notified Teresa Hunter and she will have patient come in.

## 2014-05-30 NOTE — Patient Instructions (Addendum)
Go to [facility] 12:15pm register 1st floor radiology for CTA Chest. Nothing to eat or 4 hours prior to app.    Deep Vein Thrombosis A deep vein thrombosis (DVT) is a blood clot that develops in the deep, larger veins of the leg, arm, or pelvis. These are more dangerous than clots that might form in veins near the surface of the body. A DVT can lead to serious and even life-threatening complications if the clot breaks off and travels in the bloodstream to the lungs.  A DVT can damage the valves in your leg veins so that instead of flowing upward, the blood pools in the lower leg. This is called post-thrombotic syndrome, and it can result in pain, swelling, discoloration, and sores on the leg. CAUSES Usually, several things contribute to the formation of blood clots. Contributing factors include:  The flow of blood slows down.  The inside of the vein is damaged in some way.  You have a condition that makes blood clot more easily. RISK FACTORS Some people are more likely than others to develop blood clots. Risk factors include:   Smoking.  Being overweight (obese).  Sitting or lying still for a long time. This includes long-distance travel, paralysis, or recovery from an illness or surgery. Other factors that increase risk are:   Older age, especially over 55 years of age.  Having a family history of blood clots or if you have already had a blot clot.  Having major or lengthy surgery. This is especially true for surgery on the hip, knee, or belly (abdomen). Hip surgery is particularly high risk.  Having a long, thin tube (catheter) placed inside a vein during a medical procedure.  Breaking a hip or leg.  Having cancer or cancer treatment.  Pregnancy and childbirth.  Hormone changes make the blood clot more easily during pregnancy.  The fetus puts pressure on the veins of the pelvis.  There is a risk of injury to veins during delivery or a caesarean delivery. The  risk is highest just after childbirth.  Medicines containing the female hormone estrogen. This includes birth control pills and hormone replacement therapy.  Other circulation or heart problems.  SIGNS AND SYMPTOMS When a clot forms, it can either partially or totally block the blood flow in that vein. Symptoms of a DVT can include:  Swelling of the leg or arm, especially if one side is much worse.  Warmth and redness of the leg or arm, especially if one side is much worse.  Pain in an arm or leg. If the clot is in the leg, symptoms may be more noticeable or worse when standing or walking. The symptoms of a DVT that has traveled to the lungs (pulmonary embolism, PE) usually start suddenly and include:  Shortness of breath.  Coughing.  Coughing up blood or blood-tinged mucus.  Chest pain. The chest pain is often worse with deep breaths.  Rapid heartbeat. Anyone with these symptoms should get emergency medical treatment right away. Do not wait to see if the symptoms will go away. Call your local emergency services (911 in the [place] if you have these symptoms. Do not drive yourself to the hospital. DIAGNOSIS If a DVT is suspected, your health care provider will take a full medical history and perform a physical exam. Tests that also may be required include:  Blood tests, including studies of the clotting properties of the blood.  Ultrasound to see if you have clots in your legs or lungs.  X-rays to show the flow of blood when dye is injected into the veins (venogram).  Studies of your lungs if you have any chest symptoms. PREVENTION  Exercise the legs regularly. Take a brisk 30-minute walk every day.  Maintain a weight that is appropriate for your height.  Avoid sitting or lying in bed for long periods of time without moving your legs.  Women, particularly those over the age of 35 years, should consider the risks and benefits of taking estrogen medicines, including birth  control pills.  Do not smoke, especially if you take estrogen medicines.  Long-distance travel can increase your risk of DVT. You should exercise your legs by walking or pumping the muscles every hour.  Many of the risk factors above relate to situations that exist with hospitalization, either for illness, injury, or elective surgery. Prevention may include medical and nonmedical measures.  Your health care provider will assess you for the need for venous thromboembolism prevention when you are admitted to the hospital. If you are having surgery, your surgeon will assess you the day of or day after surgery. TREATMENT Once identified, a DVT can be treated. It can also be prevented in some circumstances. Once you have had a DVT, you may be at increased risk for a DVT in the future. The most common treatment for DVT is blood-thinning (anticoagulant) medicine, which reduces the blood's tendency to clot. Anticoagulants can stop new blood clots from forming and stop old clots from growing. They cannot dissolve existing clots. Your body does this by itself over time. Anticoagulants can be given by mouth, through an IV tube, or by injection. Your health care provider will determine the best program for you. Other medicines or treatments that may be used are:  Heparin or related medicines (low molecular weight heparin) are often the first treatment for a blood clot. They act quickly. However, they cannot be taken orally and must be given either in shot form or by IV tube.  Heparin can cause a fall in a component of blood that stops bleeding and forms blood clots (platelets). You will be monitored with blood tests to be sure this does not occur.  Warfarin is an anticoagulant that can be swallowed. It takes a few days to start working, so usually heparin or related medicines are used in combination. Once warfarin is working, heparin is usually stopped.  Factor Xa inhibitor medicines, such as rivaroxaban and  apixaban, also reduce blood clotting. These medicines are taken orally and can often be used without heparin or related medicines.  Less commonly, clot dissolving drugs (thrombolytics) are used to dissolve a DVT. They carry a high risk of bleeding, so they are used mainly in severe cases where your life or a part of your body is threatened.  Very rarely, a blood clot in the leg needs to be removed surgically.  If you are unable to take anticoagulants, your health care provider may arrange for you to have a filter placed in a main vein in your abdomen. This filter prevents clots from traveling to your lungs. HOME CARE INSTRUCTIONS  Take all medicines as directed by your health care provider.  Learn as much as you can about DVT.  Wear a medical alert bracelet or carry a medical alert card.  Ask your health care provider how soon you can go back to normal activities. It is important to stay active to prevent blood clots. If you are on anticoagulant medicine, avoid contact sports.  It is very  important to exercise. This is especially important while traveling, sitting, or standing for long periods of time. Exercise your legs by walking or by tightening and relaxing your leg muscles regularly. Take frequent walks.  You may need to wear compression stockings. These are tight elastic stockings that apply pressure to the lower legs. This pressure can help keep the blood in the legs from clotting. Taking Warfarin Warfarin is a daily medicine that is taken by mouth. Your health care provider will advise you on the length of treatment (usually 3-6 months, sometimes lifelong). If you take warfarin:  Understand how to take warfarin and foods that can affect how warfarin works in Public relations account executive.  Too much and too little warfarin are both dangerous. Too much warfarin increases the risk of bleeding. Too little warfarin continues to allow the risk for blood clots. Warfarin and Regular Blood Testing While taking  warfarin, you will need to have regular blood tests to measure your blood clotting time. These blood tests usually include both the prothrombin time (PT) and international normalized ratio (INR) tests. The PT and INR results allow your health care provider to adjust your dose of warfarin. It is very important that you have your PT and INR tested as often as directed by your health care provider.  Warfarin and Your Diet Avoid major changes in your diet, or notify your health care provider before changing your diet. Arrange a visit with a registered dietitian to answer your questions. Many foods, especially foods high in vitamin K, can interfere with warfarin and affect the PT and INR results. You should eat a consistent amount of foods high in vitamin K. Foods high in vitamin K include:   Spinach, kale, broccoli, cabbage, collard and turnip greens, Brussels sprouts, peas, cauliflower, seaweed, and parsley.  Beef and pork liver.  Green tea.  Soybean oil. Warfarin with Other Medicines Many medicines can interfere with warfarin and affect the PT and INR results. You must:  Tell your health care provider about any and all medicines, vitamins, and supplements you take, including aspirin and other over-the-counter anti-inflammatory medicines. Be especially cautious with aspirin and anti-inflammatory medicines. Ask your health care provider before taking these.  Do not take or discontinue any prescribed or over-the-counter medicine except on the advice of your health care provider or pharmacist. Warfarin Side Effects Warfarin can have side effects, such as easy bruising and difficulty stopping bleeding. Ask your health care provider or pharmacist about other side effects of warfarin. You will need to:  Hold pressure over cuts for longer than usual.  Notify your dentist and other health care providers that you are taking warfarin before you undergo any procedures where bleeding may occur. Warfarin  with Alcohol and Tobacco   Drinking alcohol frequently can increase the effect of warfarin, leading to excess bleeding. It is best to avoid alcoholic drinks or to consume only very small amounts while taking warfarin. Notify your health care provider if you change your alcohol intake.   Do not use any tobacco products including cigarettes, chewing tobacco, or electronic cigarettes. If you smoke, quit. Ask your health care provider for help with quitting smoking. Alternative Medicines to Warfarin: Factor Xa Inhibitor Medicines  These blood-thinning medicines are taken by mouth, usually for several weeks or longer. It is important to take the medicine every single day at the same time each day.  There are no regular blood tests required when using these medicines.  There are fewer food and drug interactions than  with warfarin.  The side effects of this class of medicine are similar to those of warfarin, including excessive bruising or bleeding. Ask your health care provider or pharmacist about other potential side effects. SEEK MEDICAL CARE IF:  You notice a rapid heartbeat.  You feel weaker or more tired than usual.  You feel faint.  You notice increased bruising.  You feel your symptoms are not getting better in the time expected.  You believe you are having side effects of medicine. SEEK IMMEDIATE MEDICAL CARE IF:  You have chest pain.  You have trouble breathing.  You have new or increased swelling or pain in one leg.  You cough up blood.  You notice blood in vomit, in a bowel movement, or in urine. MAKE SURE YOU:  Understand these instructions.  Will watch your condition.  Will get help right away if you are not doing well or get worse. Document Released: 09/15/2005 Document Revised: 01/30/2014 Document Reviewed: 05/23/2013 [facility] Patient Information 2015 [place], [place]. This information is not intended to replace advice given to you by your health care provider.  Make sure you discuss any questions you have with your health care provider. Warfarin: What You Need to Know Warfarin is an anticoagulant. Anticoagulants help prevent the formation of blood clots. They also help stop the growth of blood clots. Warfarin is sometimes referred to as a "blood thinner."  Normally, when body tissues are cut or damaged, the blood clots in order to prevent blood loss. Sometimes clots form inside your blood vessels and obstruct the flow of blood through your circulatory system (thrombosis). These clots may travel through your bloodstream and become lodged in smaller blood vessels in your brain, which can cause a stroke, or in your lungs (pulmonary embolism). WHO SHOULD USE WARFARIN? Warfarin is prescribed for people at risk of developing harmful blood clots:  People with surgically implanted mechanical heart valves, irregular heart rhythms called atrial fibrillation, and certain clotting disorders.  People who have developed harmful blood clotting in the past, including those who have had a stroke or a pulmonary embolism, or thrombosis in their legs (deep vein thrombosis [DVT]).  People with an existing blood clot, such as a pulmonary embolism. WARFARIN DOSING Warfarin tablets come in different strengths. Each tablet strength is a different color, with the amount of warfarin (in milligrams) clearly printed on the tablet. If the color of your tablet is different than usual when you receive a new prescription, report it immediately to your pharmacist or health care provider. WARFARIN MONITORING The goal of warfarin therapy is to lessen the clotting tendency of blood but not prevent clotting completely. Your health care provider will monitor the anticoagulation effect of warfarin closely and adjust your dose as needed. For your safety, blood tests called prothrombin time (PT) or international normalized ratio (INR) are used to measure the effects of warfarin. Both of these tests  can be done with a finger stick or a blood draw. The longer it takes the blood to clot, the higher the PT or INR. Your health care provider will inform you of your "target" PT or INR range. If, at any time, your PT or INR is above the target range, there is a risk of bleeding. If your PT or INR is below the target range, there is a risk of clotting. Whether you are started on warfarin while you are in the hospital or in your health care provider's office, you will need to have your PT or INR checked  within one week of starting the medicine. Initially, some people are asked to have their PT or INR checked as much as twice a week. Once you are on a stable maintenance dose, the PT or INR is checked less often, usually once every 2 to 4 weeks. The warfarin dose may be adjusted if the PT or INR is not within the target range. It is important to keep all laboratory and health care provider follow-up appointments. Not keeping appointments could result in a chronic or permanent injury, pain, or disability because warfarin is a medicine that requires close monitoring. WHAT ARE THE SIDE EFFECTS OF WARFARIN?  Too much warfarin can cause bleeding (hemorrhage) from any part of the body. This may include bleeding from the gums, blood in the urine, bloody or dark stools, a nosebleed that is not easily stopped, coughing up blood, or vomiting blood.  Too little warfarin can increase the risk of blood clots.  Too little or too much warfarin can also increase the risk of a stroke.  Warfarin use may cause a skin rash or irritation, an unusual fever, continual nausea or stomach upset, or severe pain in your joints or back. SPECIAL PRECAUTIONS WHILE TAKING WARFARIN Warfarin should be taken exactly as directed. It is very important to take warfarin as directed since bleeding or blood clots could result in chronic or permanent injury, pain, or disability.  Take your medicine at the same time every day. If you forget to take  your dose, you can take it if it is within 6 hours of when it was due.  Do not change the dose of warfarin on your own to make up for missed or extra doses.  If you miss more than 2 doses in a row, you should contact your health care provider for advice. Avoid situations that cause bleeding. You may have a tendency to bleed more easily than usual while taking warfarin. The following actions can limit bleeding:  Using a softer toothbrush.  Flossing with waxed floss rather than unwaxed floss.  Shaving with an Neurosurgeon rather than a blade.  Limiting the use of sharp objects.  Avoiding potentially harmful activities, such as contact sports. Warfarin and Pregnancy or Breastfeeding  Warfarin is not advised during the first trimester of pregnancy due to an increased risk of birth defects. In certain situations, a woman may take warfarin after her first trimester of pregnancy. A woman who becomes pregnant or plans to become pregnant while taking warfarin should notify her health care provider immediately.  Although warfarin does not pass into breast milk, a woman who wishes to breastfeed while taking warfarin should also consult with her health care provider. Alcohol, Smoking, and Illicit Drug Use  Alcohol affects how warfarin works in the body. It is best to avoid alcoholic drinks or consume very small amounts while taking warfarin. In general, alcohol intake should be limited to 1 oz (30 mL) of liquor, 6 oz (180 mL) of wine, or 12 oz (360 mL) of beer each day. Notify your health care provider if you change your alcohol intake.  Smoking affects how warfarin works. It is best to avoid smoking while taking warfarin. Notify your health care provider if you change your smoking habits.  It is best to avoid all illicit drugs while taking warfarin since there are few studies that show how warfarin interacts with these drugs. Other Medicines and Dietary Supplements Many prescription and  over-the-counter medicines can interfere with warfarin. Be sure all of your  health care providers know you are taking warfarin. Notify your health care provider who prescribed warfarin for you or your pharmacist before starting or stopping any new medicines, including over-the-counter vitamins, dietary supplements, and pain medicines. Your warfarin dose may need to be adjusted. Some common over-the-counter medicines that may increase the risk of bleeding while taking warfarin include:   Acetaminophen.  Aspirin.  Nonsteroidal anti-inflammatory medicines (NSAIDs), such as ibuprofen or naproxen.  Vitamin E. Dietary Considerations  Foods that have moderate or high amounts of vitamin K can interfere with warfarin. Avoid major changes in your diet or notify your health care provider before changing your diet. Eat a consistent amount of foods that have moderate or high amounts of vitamin K. Eating less foods containing vitamin K can increase the risk of bleeding. Eating more foods containing vitamin K can increase the risk of blood clots. Additional questions about dietary considerations can be discussed with a dietitian. Foods that are very high in vitamin K:  Greens, such as Swiss chard and beet, collard, mustard, or turnip greens (fresh or frozen, cooked).  Kale (fresh or frozen, cooked).  Parsley (raw).  Spinach (cooked). Foods that are high in vitamin K:  Asparagus (frozen, cooked).  Beans, green (frozen, cooked).  Broccoli.  Bok choy (cooked).  Brussels sprouts (fresh or frozen, cooked).  Cabbage (cooked).   Coleslaw. Foods that are moderately high in vitamin K:  Blueberries.  Black-eyed peas.  Endive (raw).  Green leaf lettuce (raw).  Green scallions (raw).  Kale (raw).  Okra (frozen, cooked).  Plantains (fried).  Romaine lettuce (raw).  Sauerkraut (canned).  Spinach (raw). CALL YOUR CLINIC OR HEALTH CARE PROVIDER IF YOU:  Plan to have any surgery or  procedure.  Feel sick, especially if you have diarrhea or vomiting.  Experience or anticipate any major changes in your diet.  Start or stop a prescription or over-the-counter medicine.  Become, plan to become, or think you may be pregnant.  Are having heavier than usual menstrual periods.  Have had a fall, accident, or any symptoms of bleeding or unusual bruising.  Develop an unusual fever. CALL 911 IN THE [place] OR GO TO THE EMERGENCY DEPARTMENT IF YOU:   Think you may be having an allergic reaction to warfarin. The signs of an allergic reaction could include itching, rash, hives, swelling, chest tightness, or trouble breathing.  See signs of blood in your urine. The signs could include reddish, pinkish, or tea-colored urine.  See signs of blood in your stools. The signs could include bright red or black stools.  Vomit or cough up blood. In these instances, the blood could have either a bright red or a "coffee-grounds" appearance.  Have bleeding that will not stop after applying pressure for 30 minutes such as cuts, nosebleeds, or other injuries.  Have severe pain in your joints or back.  Have a new and severe headache.  Have sudden weakness or numbness of your face, arm, or leg, especially on one side of your body.  Have sudden confusion or trouble understanding.  Have sudden trouble seeing in one or both eyes.  Have sudden trouble walking, dizziness, loss of balance, or coordination.  Have trouble speaking or understanding (aphasia). Document Released: 09/15/2005 Document Revised: 01/30/2014 Document Reviewed: 03/11/2013 [facility] Patient Information 2015 [place], [place]. This information is not intended to replace advice given to you by your health care provider. Make sure you discuss any questions you have with your health care provider.

## 2014-05-30 NOTE — Progress Notes (Signed)
Subjective:    Patient ID: Teresa Hunter, female    DOB: August 23, 1966, 48 y.o.   MRN: [id number] Chief Complaint  Patient presents with  . Follow-up    DVT    HPI  Develop-ed medial Rt calf tenderness 2 wks ago - saw dr who put her on naprosyn - saw Dr. August Luz off of [address].  Has had long-standing intermittent Rt knee prob so was attributed to that but then noticed 2d ago it started worsening and pain at rest and increased tenderness to palpation.  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 No asa or regular blood thinners, just started mobic yesterday.  No h/o DVT but sister had DVT - sister is an OT and also developed one spontaneously - was <6 mos prev and she was admitted to the hosp for about a wk, saw hematology, had to do lovenox injections, couldn't get INR to be stable and therapeutic.  She was hospitalized in [place] though is from the area - she works at [employer] through [employer]. Has had a lot of cough and occ ShoB due to allergies on inhaler and has had for a few months.  Has anxiety which does cause some CP and is treated w/ prn ativan - is OCD (mild).  Had a bed spell last night - woke up coughing and diaphoretic.  Past Medical History  Diagnosis Date  . Migraine headache   . Insomnia   . Anxiety   . GERD (gastroesophageal reflux disease)   . Family history of anesthesia complication     pt's mother slow to wake up   . Hypertension 2005    stress related /weight fluctuates.   . Allergy   . Asthma    Current Outpatient Prescriptions on File Prior to Visit  Medication Sig Dispense Refill  . albuterol (PROVENTIL HFA;VENTOLIN HFA) 108 (90 BASE) MCG/ACT inhaler Inhale 1-2 puffs into the lungs every 6 (six) hours as needed for wheezing or shortness of breath.  1 Inhaler  0  . LORazepam (ATIVAN) 0.5 MG tablet Take 0.5 mg by mouth every 8 (eight) hours.      . meloxicam (MOBIC) 7.5 MG tablet Take 1 tablet (7.5 mg total) by mouth daily. May take 2  if needed  60 tablet  1  . phentermine 37.5 MG capsule Take 37.5 mg by mouth every morning.       No current facility-administered medications on file prior to visit.   No Known Allergies  Review of Systems  Constitutional: Positive for diaphoresis and fatigue. Negative for fever, chills and appetite change.  Eyes: Negative for visual disturbance.  Respiratory: Positive for cough, chest tightness and shortness of breath.   Cardiovascular: Positive for chest pain. Negative for palpitations and leg swelling.  Gastrointestinal: Negative for abdominal pain, diarrhea, constipation, blood in stool and anal bleeding.  Genitourinary: Negative for decreased urine volume.  Musculoskeletal: Positive for arthralgias, joint swelling and myalgias.  Neurological: Negative for syncope and headaches.  Hematological: Does not bruise/bleed easily.  Psychiatric/Behavioral: The patient is nervous/anxious.        Objective:  BP 130/88  Pulse 80  Temp(Src) 97.8 F (36.6 C) (Oral)  Resp 16  Ht  (1.778 m)  Wt 287 lb 12.8 oz (130.545 kg)  BMI 41.29 kg/m2  SpO2 99%  Physical Exam  Constitutional: She is oriented to person, place, and time. She appears well-developed and well-nourished. No distress.  HENT:  Head: Normocephalic  and atraumatic.  Right Ear: External ear normal.  Left Ear: External ear normal.  Eyes: Conjunctivae are normal. No scleral icterus.  Neck: Normal range of motion. Neck supple. No thyromegaly present.  Cardiovascular: Normal rate, regular rhythm, normal heart sounds and intact distal pulses.   Pulmonary/Chest: Effort normal and breath sounds normal. No respiratory distress.  Musculoskeletal: She exhibits no edema.  Lymphadenopathy:    She has no cervical adenopathy.  Neurological: She is alert and oriented to person, place, and time.  Skin: Skin is warm and dry. She is not diaphoretic. No erythema.  Psychiatric: She has a normal mood and affect. Her behavior is normal.            Assessment & Plan:   DVT (deep venous thrombosis), right - Plan: CT Angio Chest W/Cm &/Or Wo Cm, POCT CBC, COMPLETE METABOLIC PANEL WITH GFR, Ambulatory referral to Internal Medicine - recommend pt start on xarelto - explained risks (irreversible in case of acute bleed - worst being GI or head bleed) - pt discussed this w/ her husband and her sister who is currently on warfarin and she declines to start xarelto - would rather do coumadin - understands she needs to establish w/ a coumadin clinic asap within 1 wk - rec [facility] for this.  Needs to stay on 1 mg/kig lovenox bid x 5d or until warfarin therapeutic w/ INR 2-3 - whenever occurs last. Recommend eval by hematology in 6 mos prior to coming of blood thinners as DVT spontaneous and with + FHx w/ sister also w/ unprovoked DVT so will need testing for underlying etiology.  Pt understands and is agreeable - will ask for referral from PCP.  Chest pain, unspecified chest pain type - Plan: CT Angio Chest W/Cm &/Or Wo Cm, POCT CBC, COMPLETE METABOLIC PANEL WITH GFR - sxs unlikely to be PE due to sxs prior to diagnosis of recent DVT but will check CTA to ensure no PE.  Palpitations - Plan: CT Angio Chest W/Cm &/Or Wo Cm, POCT CBC, COMPLETE METABOLIC PANEL WITH GFR  Cough - Plan: CT Angio Chest W/Cm &/Or Wo Cm, POCT CBC, COMPLETE METABOLIC PANEL WITH GFR  Blood thinned due to long-term anticoagulant use - Plan: Ambulatory referral to Internal Medicine  Meds ordered this encounter  Medications  . DISCONTD: Rivaroxaban (XARELTO) 15 MG TABS tablet    Sig: Take 1 tablet (15 mg total) by mouth 2 (two) times daily with a meal.    Dispense:  42 tablet    Refill:  0  . enoxaparin (LOVENOX) 150 MG/ML injection    Sig: 0.9 mL (=130mg  = 1 mg/kg) [place] q12 hrs until directed by physician AND INR therapeutic    Dispense:  14 mL    Refill:  1    CANCEL PREV RX FOR XARELTO  . warfarin (COUMADIN) 5 MG tablet    Sig: Take 1 tablet (5 mg  total) by mouth daily.    Dispense:  30 tablet    Refill:  0     Norberto Sorenson, MD MPH

## 2014-05-30 NOTE — Progress Notes (Signed)
*  Preliminary Results* Right lower extremity venous duplex completed. Right lower extremity is positive for deep vein thrombosis in a small segment of a single right posterior tibial vein. There is no evidence of right Baker's cyst.  05/30/2014 2:46 PM  Gertie Fey, RVT, RDCS, RDMS

## 2015-08-03 IMAGING — CT CT ANGIO CHEST
2 of 6 series · 19 of 36 positions shown · IV contrast (OMNIPAQUE)
Comparison: None.

CLINICAL DATA: Chest pain and palpitations

EXAM:
CT ANGIOGRAPHY CHEST WITH CONTRAST
TECHNIQUE: Multidetector CT imaging of the chest was performed using the
standard protocol during bolus administration of intravenous
contrast. Multiplanar CT image reconstructions and MIPs were
obtained to evaluate the vascular anatomy.
CONTRAST:  100mL OMNIPAQUE IOHEXOL 350 MG/ML SOLN

[Series 6: pe thins @ 1mm · axial · 0.67mm/px · z∈[+1042,+1262]mm · 18 of 246 slices shown]
[im 13/246  lung]
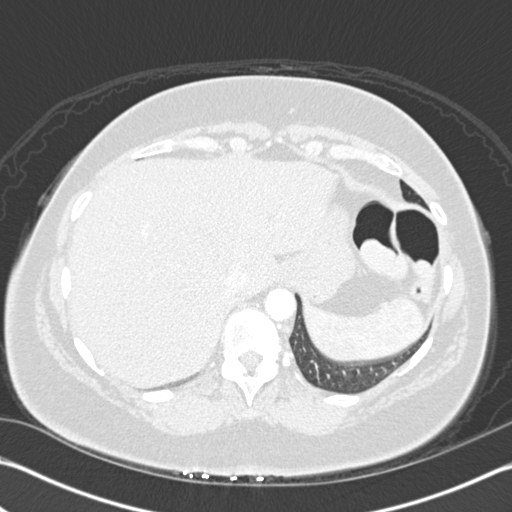
[im 25/246  mediastinal]
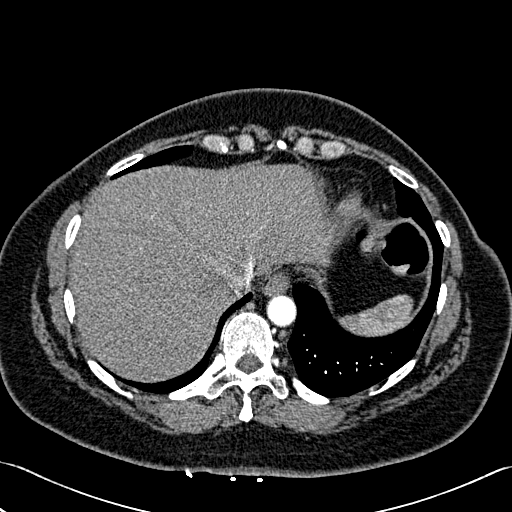
[im 37/246  lung]
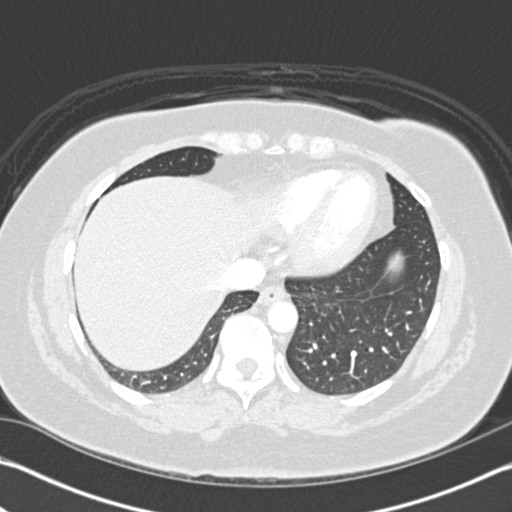
[im 50/246  mediastinal]
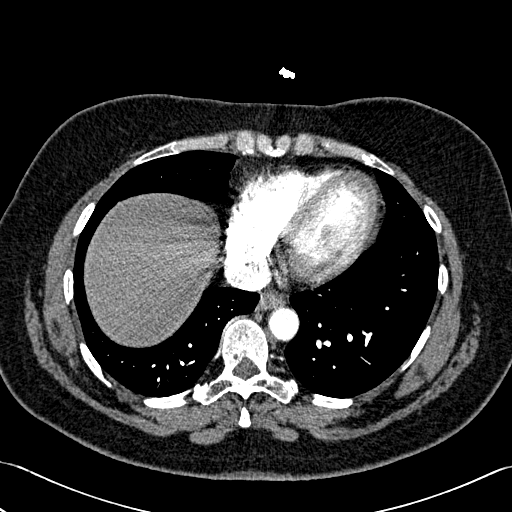
[im 62/246  lung]
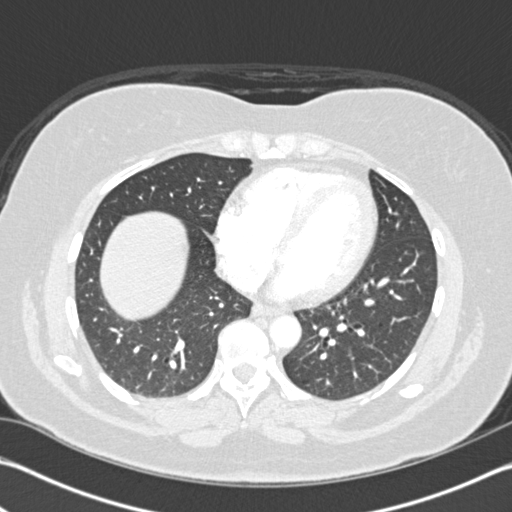
[im 74/246  mediastinal]
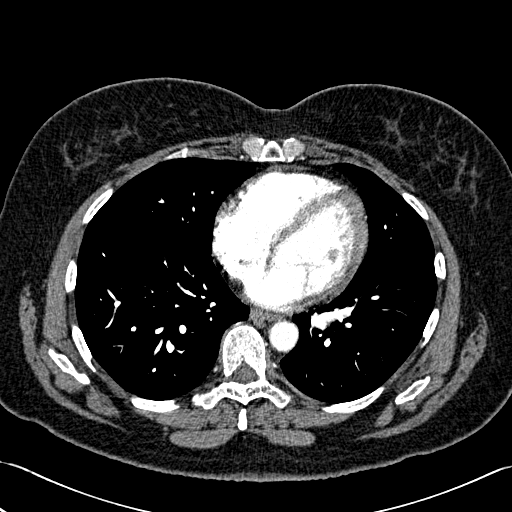
[im 86/246  lung]
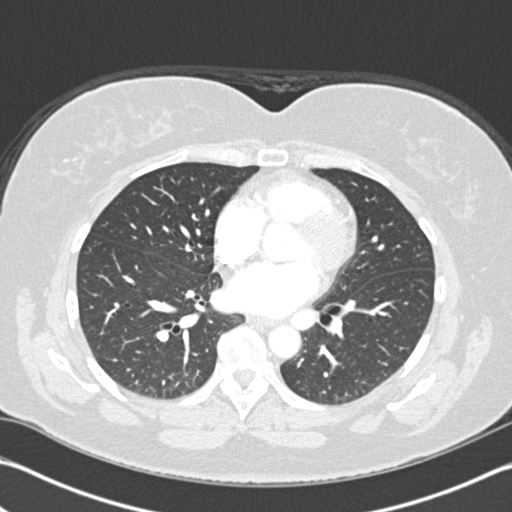
[im 99/246  mediastinal]
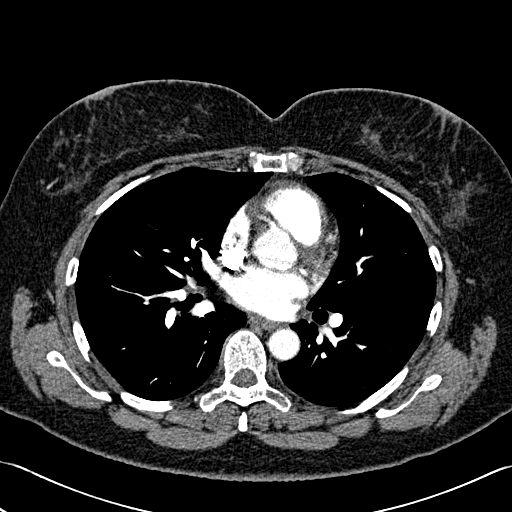
[im 111/246  lung]
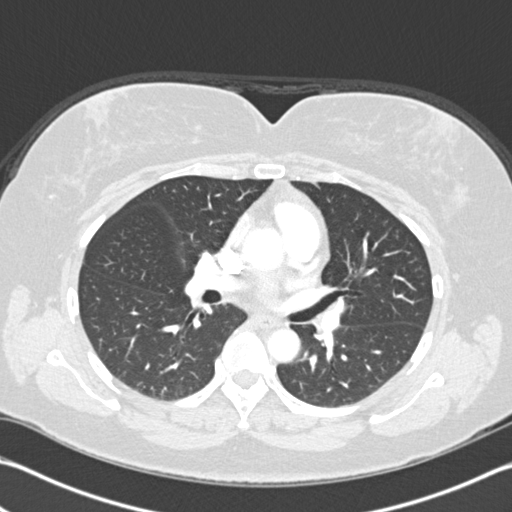
[im 135/246  mediastinal]
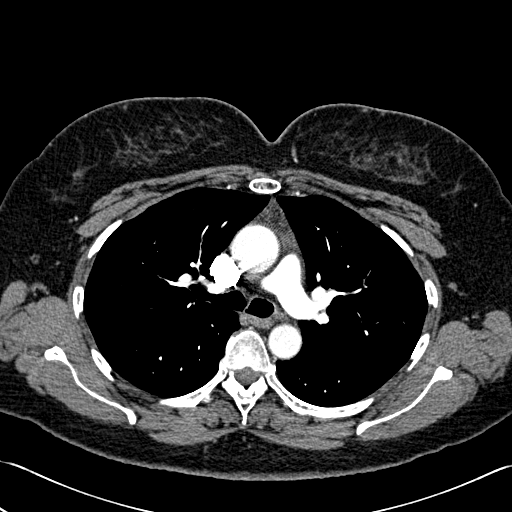
[im 148/246  lung]
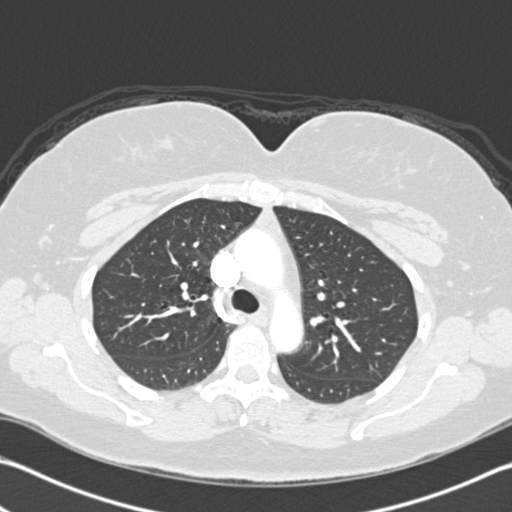
[im 160/246  mediastinal]
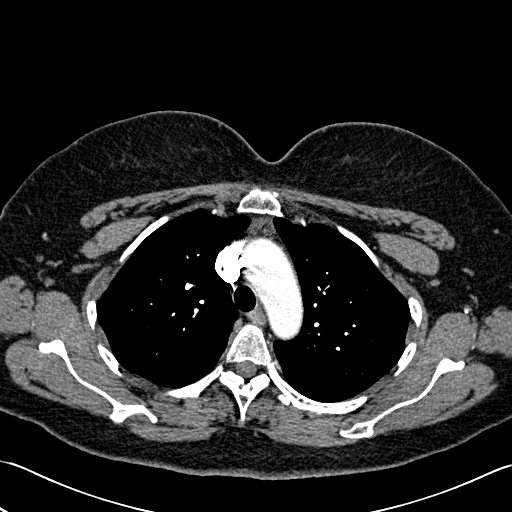
[im 172/246  lung]
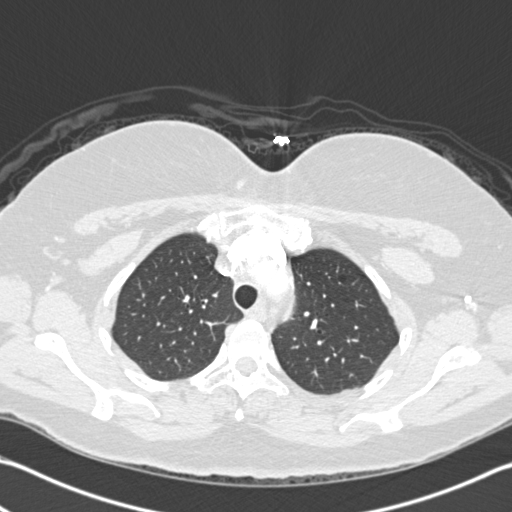
[im 184/246  mediastinal]
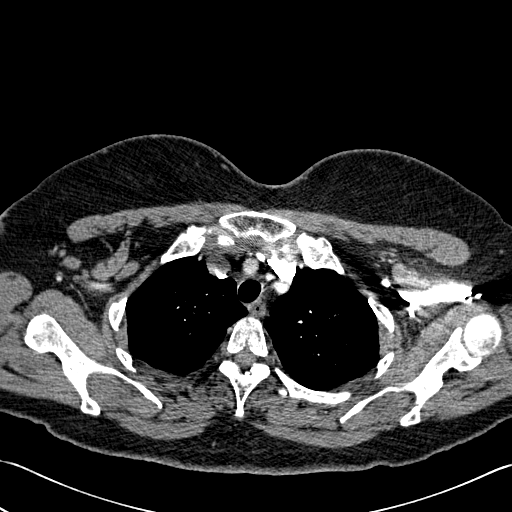
[im 197/246  lung]
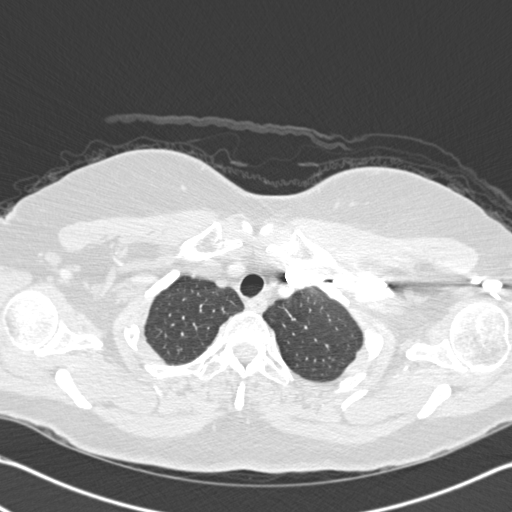
[im 209/246  mediastinal]
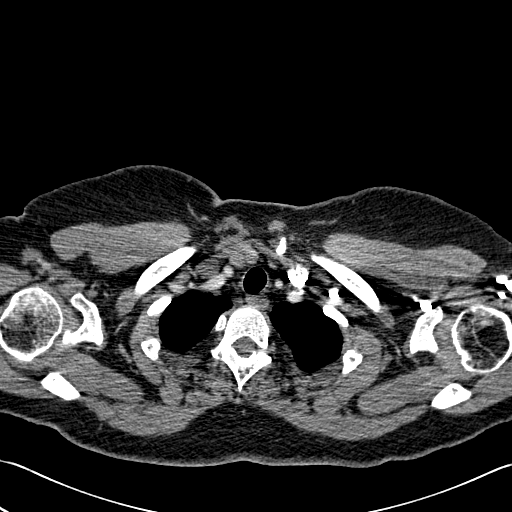
[im 221/246  lung]
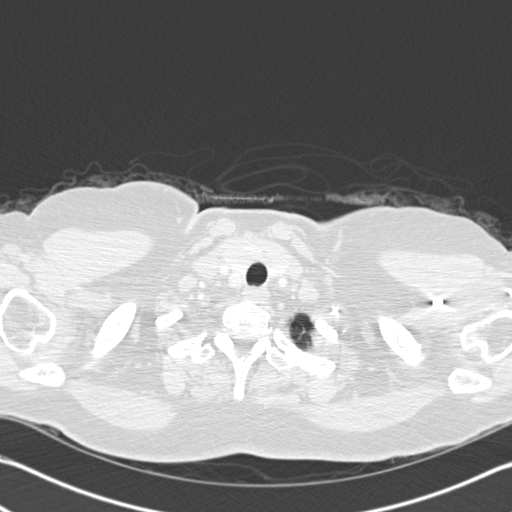
[im 233/246  mediastinal]
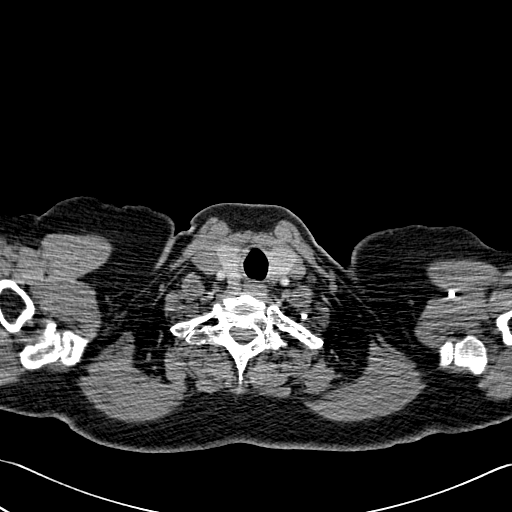

[Series 602: <mpr thick range> · coronal · 0.67mm/px · 1 of 128 slices shown]
[im 64/128  mediastinal]
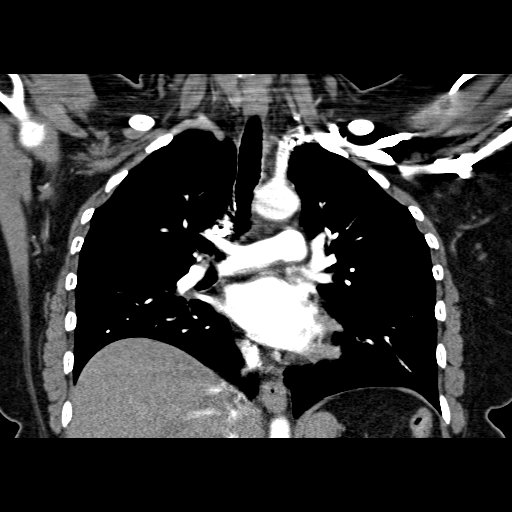

[19 of 36 positions shown; findings below may reference images not displayed]

FINDINGS: THORACIC INLET/BODY WALL:

No acute abnormality.

MEDIASTINUM:

Normal heart size. No pericardial effusion. No aortic aneurysm or
dissection. No evidence of pulmonary embolism. No adenopathy.

LUNG WINDOWS:

No consolidation.  No effusion.  No suspicious pulmonary nodule.

UPPER ABDOMEN:

No acute findings.

OSSEOUS:

No acute fracture.  No suspicious lytic or blastic lesions.

Review of the MIP images confirms the above findings.
IMPRESSION: Negative for pulmonary embolism or other acute intrathoracic
abnormality.

## 2016-05-24 ENCOUNTER — Encounter (HOSPITAL_COMMUNITY): Payer: Self-pay | Admitting: *Deleted

## 2016-05-24 ENCOUNTER — Emergency Department (HOSPITAL_COMMUNITY)
Admission: EM | Admit: 2016-05-24 | Discharge: 2016-05-24 | Disposition: A | Discharge status: Home / Self Care | Payer: Self-pay | Attending: Emergency Medicine | Admitting: Emergency Medicine

## 2016-05-24 DIAGNOSIS — Z79899 Other long term (current) drug therapy: Secondary | ICD-10-CM | POA: Insufficient documentation

## 2016-05-24 DIAGNOSIS — Z23 Encounter for immunization: Secondary | ICD-10-CM | POA: Insufficient documentation

## 2016-05-24 DIAGNOSIS — S20211A Contusion of right front wall of thorax, initial encounter: Secondary | ICD-10-CM | POA: Insufficient documentation

## 2016-05-24 DIAGNOSIS — S50812A Abrasion of left forearm, initial encounter: Secondary | ICD-10-CM | POA: Insufficient documentation

## 2016-05-24 DIAGNOSIS — Y999 Unspecified external cause status: Secondary | ICD-10-CM | POA: Insufficient documentation

## 2016-05-24 DIAGNOSIS — I10 Essential (primary) hypertension: Secondary | ICD-10-CM | POA: Insufficient documentation

## 2016-05-24 DIAGNOSIS — W5503XA Scratched by cat, initial encounter: Secondary | ICD-10-CM | POA: Insufficient documentation

## 2016-05-24 DIAGNOSIS — J45909 Unspecified asthma, uncomplicated: Secondary | ICD-10-CM | POA: Insufficient documentation

## 2016-05-24 DIAGNOSIS — Y929 Unspecified place or not applicable: Secondary | ICD-10-CM | POA: Insufficient documentation

## 2016-05-24 DIAGNOSIS — Y939 Activity, unspecified: Secondary | ICD-10-CM | POA: Insufficient documentation

## 2016-05-24 MED ORDER — BACITRACIN ZINC 500 UNIT/GM EX OINT
TOPICAL_OINTMENT | CUTANEOUS | Status: AC
  Filled 2016-05-24: qty 2.7

## 2016-05-24 MED ORDER — TETANUS-DIPHTH-ACELL PERTUSSIS 5-2.5-18.5 LF-MCG/0.5 IM SUSP
0.5000 mL | Freq: Once | INTRAMUSCULAR | Status: AC
  Administered 2016-05-24: 0.5 mL via INTRAMUSCULAR
  Filled 2016-05-24: qty 0.5

## 2016-05-24 NOTE — ED Triage Notes (Signed)
Pt reports she was scratched by her cat on her left forearm and left chest today at 1PM when trying to give the cat a bath.

## 2016-05-24 NOTE — Discharge Instructions (Signed)
Read the information below.   Wash wounds with warm water and soap. Apply antibiotic ointment. Keep covered.  You can take tylenol or motrin for pain relief. You can apply ice to affected areas.  Follow up with your primary doctor in 2-3 days for wound recheck.  You may return to the Emergency Department at any time for worsening condition or any new symptoms that concern you. Return to ED if you develop fever, worsening redness/swelling/purulent drainage at wounds or notice streaking, have myalgias or generalized fatigue.

## 2016-05-24 NOTE — ED Provider Notes (Signed)
[facility] DEPT Provider Note   CSN: [id number] Arrival date & time: 05/24/16  1840   By signing my name below, I, Dolores Hoose, attest that this documentation has been prepared under the direction and in the presence of non-physician practitioner, Gay Filler, PA-C.Marland Kitchen Electronically Signed: Dolores Hoose, Scribe. 05/24/2016. 7:48 PM.  History   Chief Complaint Chief Complaint  Patient presents with  . cat scratch   HPI   HPI Comments:  Teresa Hunter is a 50 y.o. female with PMHx of blood clots on Xarelto, HTN, asthma, anxiety who presents to the Emergency Department complaining of sudden-onset unchanged chest and arm wounds from cat occurring earlier today. Pt reports she was trying to bathe her kitten when she was scratched on her left forearm and chest. Patient denies any cat bites. Pt denies the cat has been exposed to rabies, has had a rabies vaccine, and is due for rabies booster on Tuesday. She cleaned wounds and applied ABX ointment at home PTA. She notes some bruising, swelling, redness, and burning sensation around wounds. No purulent drainage. She denies fever, shortness, of breath, chest pain, arthralgias, myalgias, headache, numbness, weakness, nausea, vomiting, or any immunocompromising conditions.    Past Medical History:  Diagnosis Date  . Allergy   . Anxiety   . Asthma   . Family history of anesthesia complication    pt's mother slow to wake up   . GERD (gastroesophageal reflux disease)   . Hypertension 2005   stress related /weight fluctuates.   . Insomnia   . Migraine headache     Patient Active Problem List   Diagnosis Date Noted  . Chronic cholecystitis 07/14/2012    Past Surgical History:  Procedure Laterality Date  . CHOLECYSTECTOMY  08/06/2012   Procedure: LAPAROSCOPIC CHOLECYSTECTOMY;  Surgeon: Harl Bowie, MD;  Location: [facility];  Service: General;  Laterality: N/A;  . COLONOSCOPY  2013 october- Dr Collene Mares   removal of polyps with  endoscopy /colonoscopy  . EYE SURGERY     laser  . laser     B/L glaucoma  . TONSILLECTOMY      OB History    No data available       Home Medications    Prior to Admission medications   Medication Sig Start Date End Date Taking? Authorizing Provider  albuterol (PROVENTIL HFA;VENTOLIN HFA) 108 (90 BASE) MCG/ACT inhaler Inhale 1-2 puffs into the lungs every 6 (six) hours as needed for wheezing or shortness of breath. 08/30/13   Carmin Muskrat, MD  enoxaparin (LOVENOX) 150 MG/ML injection 0.9 mL (=143m = 1 mg/kg) [place] q12 hrs until directed by physician AND INR therapeutic 05/30/14   EShawnee Knapp MD  LORazepam (ATIVAN) 0.5 MG tablet Take 0.5 mg by mouth every 8 (eight) hours.    Historical Provider, MD  meloxicam (MOBIC) 7.5 MG tablet Take 1 tablet (7.5 mg total) by mouth daily. May take 2 if needed 05/29/14   JDarreld Mclean MD  phentermine 37.5 MG capsule Take 37.5 mg by mouth every morning.    Historical Provider, MD  warfarin (COUMADIN) 5 MG tablet Take 1 tablet (5 mg total) by mouth daily. 05/30/14   EShawnee Knapp MD    Family History Family History  Problem Relation Age of Onset  . Cancer Mother     multiple myeloma  . Hypertension Sister   . Cancer Maternal Grandmother   . Cancer Maternal Grandfather   . Hypertension Paternal Grandmother   . Cancer Paternal Grandfather  Social History Social History  Substance Use Topics  . Smoking status: Never Smoker  . Smokeless tobacco: Not on file  . Alcohol use No     Allergies   Review of patient's allergies indicates no known allergies.   Review of Systems Review of Systems  Constitutional: Negative for fever.  Respiratory: Negative for shortness of breath.   Cardiovascular: Negative for chest pain.  Gastrointestinal: Negative for nausea and vomiting.  Musculoskeletal: Negative for arthralgias and myalgias.  Skin: Positive for color change and wound.  Allergic/Immunologic: Negative for immunocompromised state.    Neurological: Negative for dizziness, syncope, weakness, light-headedness, numbness and headaches.     Physical Exam Updated Vital Signs BP 131/88 (BP Location: Right Arm)   Pulse 84   Temp 98.1 F (36.7 C)   Resp 18   SpO2 98%   Physical Exam  Constitutional: She appears well-developed and well-nourished. No distress.  HENT:  Head: Normocephalic and atraumatic.  Eyes: Conjunctivae and EOM are normal. Right eye exhibits no discharge. Left eye exhibits no discharge. No scleral icterus.  Neck: Normal range of motion and phonation normal. No neck rigidity. Normal range of motion present.  Cardiovascular: Normal rate, regular rhythm, normal heart sounds and intact distal pulses.   Pulmonary/Chest: Effort normal and breath sounds normal. No stridor.  Abdominal: Normal appearance. She exhibits no distension.  Musculoskeletal: Normal range of motion.  Neurological: She is alert. She is not disoriented. GCS eye subscore is 4. GCS verbal subscore is 5. GCS motor subscore is 6.  Sensation and strength in upper extremities grossly intact.  2+ radial pulses.   Skin: Skin is warm and dry. She is not diaphoretic.     Scratch/puncture marks from cat claws noted. No visible foreign body.   Psychiatric: She has a normal mood and affect. Her behavior is normal. Judgment normal.  Nursing note and vitals reviewed.    ED Treatments / Results  DIAGNOSTIC STUDIES:  Oxygen Saturation is 98% on RA, normal by my interpretation.    COORDINATION OF CARE:  7:47 PM Discussed treatment plan with pt at bedside which included tetanus and pain killers and pt agreed to plan.  Labs (all labs ordered are listed, but only abnormal results are displayed) Labs Reviewed - No data to display  EKG  EKG Interpretation None       Radiology No results found.  Procedures Procedures (including critical care time)  Medications Ordered in ED Medications  Tdap (BOOSTRIX) injection 0.5 mL (0.5 mLs  Intramuscular Given 05/24/16 2057)     Initial Impression / Assessment and Plan / ED Course  I have reviewed the triage vital signs and the nursing notes.  Pertinent labs & imaging results that were available during my care of the patient were reviewed by me and considered in my medical decision making (see chart for details).  Clinical Course    Patient presents to ED for cat scratch today. Patient is afebrile and non-toxic appearing in NAD. VSS. Physical exam remarkable for scratch/nail puncture wounds on left forearm and left chest with mild surrounding erythema, swelling, and bruising, and superficial abrasion with ecchymosis on right chest. No streaking, warmth, or purulent discharge noted. No visible foreign body. Bleeding is controlled. Patient reports cleaning wounds and applying ABX ointment before arrival. No other reported sxs. Indoor cat, has had rabies shot, but is due for booster on Tuesday, no known exposure to rabies. Low suspicion for rabies. Low suspicion for systemic infection, do not think PO ABX necessary  at this time. Wounds irrigated, ABX ointment applied, and dressings applied in ED. Tdap updated. Discussed wound care and signs of infection. Encouraged wound check with PCP in 2-3 days. Return precautions discussed. Patient voiced understanding and is agreeable.   I personally performed the services described in this documentation, which was scribed in my presence. The recorded information has been reviewed and is accurate.  Final Clinical Impressions(s) / ED Diagnoses   Final diagnoses:  Cat scratch    New Prescriptions Discharge Medication List as of 05/24/2016  9:26 PM        Roxanna Mew, PA-C 05/25/16 1808    Davonna Belling, MD 05/25/16 2141

## 2016-09-28 ENCOUNTER — Encounter (HOSPITAL_COMMUNITY): Payer: Self-pay | Admitting: *Deleted

## 2016-09-28 ENCOUNTER — Encounter (HOSPITAL_COMMUNITY): Payer: Self-pay

## 2016-09-28 ENCOUNTER — Inpatient Hospital Stay (HOSPITAL_COMMUNITY)
Admission: AD | Admit: 2016-09-28 | Discharge: 2016-10-07 | DRG: 885 | Disposition: A | Discharge status: Home / Self Care | Payer: Federal, State, Local not specified - Other | Source: Intra-hospital | Attending: Psychiatry | Admitting: Psychiatry

## 2016-09-28 ENCOUNTER — Emergency Department (HOSPITAL_COMMUNITY)
Admission: EM | Admit: 2016-09-28 | Discharge: 2016-09-28 | Disposition: A | Discharge status: Other Acute Inpatient Hospital | Payer: Self-pay | Attending: Emergency Medicine | Admitting: Emergency Medicine

## 2016-09-28 DIAGNOSIS — Z79899 Other long term (current) drug therapy: Secondary | ICD-10-CM | POA: Insufficient documentation

## 2016-09-28 DIAGNOSIS — Z9114 Patient's other noncompliance with medication regimen: Secondary | ICD-10-CM

## 2016-09-28 DIAGNOSIS — Z9889 Other specified postprocedural states: Secondary | ICD-10-CM | POA: Diagnosis not present

## 2016-09-28 DIAGNOSIS — Z915 Personal history of self-harm: Secondary | ICD-10-CM | POA: Diagnosis not present

## 2016-09-28 DIAGNOSIS — Z888 Allergy status to other drugs, medicaments and biological substances status: Secondary | ICD-10-CM

## 2016-09-28 DIAGNOSIS — N979 Female infertility, unspecified: Secondary | ICD-10-CM | POA: Diagnosis present

## 2016-09-28 DIAGNOSIS — T50902D Poisoning by unspecified drugs, medicaments and biological substances, intentional self-harm, subsequent encounter: Secondary | ICD-10-CM | POA: Diagnosis not present

## 2016-09-28 DIAGNOSIS — I10 Essential (primary) hypertension: Secondary | ICD-10-CM | POA: Insufficient documentation

## 2016-09-28 DIAGNOSIS — Z808 Family history of malignant neoplasm of other organs or systems: Secondary | ICD-10-CM | POA: Diagnosis not present

## 2016-09-28 DIAGNOSIS — F332 Major depressive disorder, recurrent severe without psychotic features: Secondary | ICD-10-CM | POA: Insufficient documentation

## 2016-09-28 DIAGNOSIS — F411 Generalized anxiety disorder: Secondary | ICD-10-CM | POA: Diagnosis present

## 2016-09-28 DIAGNOSIS — T43022A Poisoning by tetracyclic antidepressants, intentional self-harm, initial encounter: Secondary | ICD-10-CM | POA: Insufficient documentation

## 2016-09-28 DIAGNOSIS — G4700 Insomnia, unspecified: Secondary | ICD-10-CM | POA: Diagnosis present

## 2016-09-28 DIAGNOSIS — Z9049 Acquired absence of other specified parts of digestive tract: Secondary | ICD-10-CM | POA: Diagnosis not present

## 2016-09-28 DIAGNOSIS — T50902A Poisoning by unspecified drugs, medicaments and biological substances, intentional self-harm, initial encounter: Secondary | ICD-10-CM

## 2016-09-28 DIAGNOSIS — Z7901 Long term (current) use of anticoagulants: Secondary | ICD-10-CM | POA: Insufficient documentation

## 2016-09-28 DIAGNOSIS — Z8249 Family history of ischemic heart disease and other diseases of the circulatory system: Secondary | ICD-10-CM | POA: Diagnosis not present

## 2016-09-28 DIAGNOSIS — J45909 Unspecified asthma, uncomplicated: Secondary | ICD-10-CM | POA: Insufficient documentation

## 2016-09-28 LAB — COMPREHENSIVE METABOLIC PANEL
ALBUMIN: 4.3 g/dL (ref 3.5–5.0)
ALK PHOS: 98 U/L (ref 38–126)
ALT: 20 U/L (ref 14–54)
AST: 24 U/L (ref 15–41)
Anion gap: 9 (ref 5–15)
BUN: 10 mg/dL (ref 6–20)
CALCIUM: 9.4 mg/dL (ref 8.9–10.3)
CHLORIDE: 107 mmol/L (ref 101–111)
CO2: 26 mmol/L (ref 22–32)
CREATININE: 0.85 mg/dL (ref 0.44–1.00)
GFR calc Af Amer: >60 mL/min (ref >60)
GFR calc non Af Amer: >60 mL/min (ref >60)
GLUCOSE: 153 mg/dL — AB (ref 65–99)
Potassium: 4 mmol/L (ref 3.5–5.1)
Sodium: 142 mmol/L (ref 135–145)
Total Bilirubin: 0.4 mg/dL (ref 0.3–1.2)
Total Protein: 7.9 g/dL (ref 6.5–8.1)

## 2016-09-28 LAB — CBG MONITORING, ED: Glucose-Capillary: 162 mg/dL — ABNORMAL HIGH (ref 65–99)

## 2016-09-28 LAB — CBC WITH DIFFERENTIAL/PLATELET
BASOS ABS: 0 10*3/uL (ref 0.0–0.1)
BASOS PCT: 1 %
EOS ABS: 0.1 10*3/uL (ref 0.0–0.7)
EOS PCT: 2 %
HCT: 40.1 % (ref 36.0–46.0)
HEMOGLOBIN: 13.3 g/dL (ref 12.0–15.0)
Lymphocytes Relative: 38 %
Lymphs Abs: 2.2 10*3/uL (ref 0.7–4.0)
MCH: 29 pg (ref 26.0–34.0)
MCHC: 33.2 g/dL (ref 30.0–36.0)
MCV: 87.4 fL (ref 78.0–100.0)
Monocytes Absolute: 0.4 10*3/uL (ref 0.1–1.0)
Monocytes Relative: 7 %
NEUTROS PCT: 52 %
Neutro Abs: 3.1 10*3/uL (ref 1.7–7.7)
PLATELETS: 263 10*3/uL (ref 150–400)
RBC: 4.59 MIL/uL (ref 3.87–5.11)
RDW: 13.9 % (ref 11.5–15.5)
WBC: 5.9 10*3/uL (ref 4.0–10.5)

## 2016-09-28 LAB — SALICYLATE LEVEL: Salicylate Lvl: <7 mg/dL (ref 2.8–30.0)

## 2016-09-28 LAB — I-STAT CHEM 8, ED
BUN: 8 mg/dL (ref 6–20)
CALCIUM ION: 1.17 mmol/L (ref 1.15–1.40)
CHLORIDE: 104 mmol/L (ref 101–111)
CREATININE: 0.8 mg/dL (ref 0.44–1.00)
Glucose, Bld: 151 mg/dL — ABNORMAL HIGH (ref 65–99)
HEMATOCRIT: 38 % (ref 36.0–46.0)
Hemoglobin: 12.9 g/dL (ref 12.0–15.0)
Potassium: 3.9 mmol/L (ref 3.5–5.1)
SODIUM: 142 mmol/L (ref 135–145)
TCO2: 25 mmol/L (ref 0–100)

## 2016-09-28 LAB — ETHANOL: Alcohol, Ethyl (B): <5 mg/dL (ref <5)

## 2016-09-28 LAB — PROTIME-INR
INR: 0.98
Prothrombin Time: 13 seconds (ref 11.4–15.2)

## 2016-09-28 LAB — RAPID URINE DRUG SCREEN, HOSP PERFORMED
AMPHETAMINES: NOT DETECTED
BARBITURATES: NOT DETECTED
BENZODIAZEPINES: NOT DETECTED
Cocaine: NOT DETECTED
Opiates: NOT DETECTED
Tetrahydrocannabinol: NOT DETECTED

## 2016-09-28 LAB — ACETAMINOPHEN LEVEL: Acetaminophen (Tylenol), Serum: <10 ug/mL — ABNORMAL LOW (ref 10–30)

## 2016-09-28 LAB — POC URINE PREG, ED: PREG TEST UR: NEGATIVE

## 2016-09-28 LAB — I-STAT CG4 LACTIC ACID, ED
Lactic Acid, Venous: 2.39 mmol/L (ref 0.5–1.9)
Lactic Acid, Venous: 0.9 mmol/L (ref 0.5–1.9)

## 2016-09-28 LAB — I-STAT TROPONIN, ED: TROPONIN I, POC: 0 ng/mL (ref 0.00–0.08)

## 2016-09-28 LAB — MAGNESIUM: Magnesium: 1.9 mg/dL (ref 1.7–2.4)

## 2016-09-28 LAB — LIPASE, BLOOD: Lipase: 34 U/L (ref 11–51)

## 2016-09-28 MED ORDER — SERTRALINE HCL 50 MG PO TABS: 50.0000 mg | ORAL_TABLET | Freq: Every day | ORAL | Status: DC

## 2016-09-28 MED ORDER — ALBUTEROL SULFATE (2.5 MG/3ML) 0.083% IN NEBU: 3.0000 mL | INHALATION_SOLUTION | Freq: Four times a day (QID) | RESPIRATORY_TRACT | Status: DC | PRN

## 2016-09-28 MED ORDER — ACETAMINOPHEN 325 MG PO TABS
650.0000 mg | ORAL_TABLET | Freq: Four times a day (QID) | ORAL | Status: DC | PRN
  Administered 2016-09-29: 650 mg via ORAL
  Filled 2016-09-28: qty 2

## 2016-09-28 MED ORDER — WARFARIN SODIUM 5 MG PO TABS: 5.0000 mg | ORAL_TABLET | Freq: Every day | ORAL | Status: DC

## 2016-09-28 MED ORDER — ALUM & MAG HYDROXIDE-SIMETH 200-200-20 MG/5ML PO SUSP: 30.0000 mL | ORAL | Status: DC | PRN

## 2016-09-28 MED ORDER — GABAPENTIN 300 MG PO CAPS
300.0000 mg | ORAL_CAPSULE | Freq: Every day | ORAL | Status: DC
Start: 2016-09-28 — End: 2016-09-28
  Administered 2016-09-28: 300 mg via ORAL
  Filled 2016-09-28: qty 1

## 2016-09-28 MED ORDER — SERTRALINE HCL 50 MG PO TABS
50.0000 mg | ORAL_TABLET | Freq: Every day | ORAL | Status: DC
  Administered 2016-09-29 – 2016-10-01 (×3): 50 mg via ORAL
  Filled 2016-09-28 (×5): qty 1

## 2016-09-28 MED ORDER — ALBUTEROL SULFATE HFA 108 (90 BASE) MCG/ACT IN AERS: 1.0000 | INHALATION_SPRAY | Freq: Four times a day (QID) | RESPIRATORY_TRACT | Status: DC | PRN

## 2016-09-28 MED ORDER — SODIUM CHLORIDE 0.9 % IV BOLUS (SEPSIS)
2000.0000 mL | Freq: Once | INTRAVENOUS | Status: AC
  Administered 2016-09-28: 2000 mL via INTRAVENOUS

## 2016-09-28 MED ORDER — ACETAMINOPHEN 325 MG PO TABS
650.0000 mg | ORAL_TABLET | Freq: Four times a day (QID) | ORAL | Status: DC | PRN
  Administered 2016-09-28: 650 mg via ORAL
  Filled 2016-09-28: qty 2

## 2016-09-28 MED ORDER — GABAPENTIN 300 MG PO CAPS
300.0000 mg | ORAL_CAPSULE | Freq: Every day | ORAL | Status: DC
  Administered 2016-09-29 – 2016-09-30 (×2): 300 mg via ORAL
  Filled 2016-09-28 (×4): qty 1

## 2016-09-28 MED ORDER — MAGNESIUM HYDROXIDE 400 MG/5ML PO SUSP: 30.0000 mL | Freq: Every day | ORAL | Status: DC | PRN

## 2016-09-28 MED ORDER — TRAZODONE HCL 50 MG PO TABS
50.0000 mg | ORAL_TABLET | Freq: Every evening | ORAL | Status: DC | PRN
  Administered 2016-09-29 – 2016-09-30 (×3): 50 mg via ORAL
  Filled 2016-09-28 (×8): qty 1

## 2016-09-28 NOTE — ED Triage Notes (Signed)
Pt resting with eyes closed, VSS, sitter at bedside, fluids complete, repeat Lactic due to elevation and need clearance to move pt to [facility] when bed is available.

## 2016-09-28 NOTE — ED Notes (Signed)
[facility]  [facility].  Patient states she took "a bunch of medications."  Patient indicated that it was an intentional overdose.  She states, "I have a lot of stress at home."  Patient is currently having marital problems.  She states she can contract for safety while in the [facility].  She is cooperative; her behavior is appropriate.  She is currently visiting with her sister.

## 2016-09-28 NOTE — ED Notes (Signed)
Informed Dr. Mora Bellmanni of lactic acid of 2.39 @ [phone]

## 2016-09-28 NOTE — ED Notes (Signed)
Patient is currently visiting with husband.  [facility] to [facility] and spouse.

## 2016-09-28 NOTE — ED Provider Notes (Signed)
[facility] DEPT Provider Note    By signing my name below, I, Bea Graff, attest that this documentation has been prepared under the direction and in the presence of Everlene Balls, MD. Electronically Signed: Bea Graff, ED Scribe. 09/28/16. 4:25 AM.    History   Chief Complaint Chief Complaint  Patient presents with  . Drug Overdose   The history is provided by the patient, the EMS personnel, the spouse and medical records. No language interpreter was used.   LEVEL 5 CAVEAT- Full history could not be obtained due to AMS.   HPI Comments:  Teresa Hunter is an obese 50 y.o. female brought in by EMS, who presents to the Emergency Department secondary to an intentional overdose PTA. Pt's husband states when he came home from work he found the patient alert in the bathtub and told him she wanted to go be with her mother whom is deceased. She told him she took 10 Mirtazapine tablets, drank an entire bottle of Robitussin and drank an unknown amount of vodka. EMS states no meds were given en route.   Past Medical History:  Diagnosis Date  . Allergy   . Anxiety   . Asthma   . Family history of anesthesia complication    pt's mother slow to wake up   . GERD (gastroesophageal reflux disease)   . Hypertension 2005   stress related /weight fluctuates.   . Insomnia   . Migraine headache     Patient Active Problem List   Diagnosis Date Noted  . Chronic cholecystitis 07/14/2012    Past Surgical History:  Procedure Laterality Date  . CHOLECYSTECTOMY  08/06/2012   Procedure: LAPAROSCOPIC CHOLECYSTECTOMY;  Surgeon: Harl Bowie, MD;  Location: [facility];  Service: General;  Laterality: N/A;  . COLONOSCOPY  2013 october- Dr Collene Mares   removal of polyps with endoscopy /colonoscopy  . EYE SURGERY     laser  . laser     B/L glaucoma  . TONSILLECTOMY      OB History    No data available       Home Medications    Prior to Admission medications   Medication Sig Start  Date End Date Taking? Authorizing Provider  albuterol (PROVENTIL HFA;VENTOLIN HFA) 108 (90 BASE) MCG/ACT inhaler Inhale 1-2 puffs into the lungs every 6 (six) hours as needed for wheezing or shortness of breath. 08/30/13   Carmin Muskrat, MD  enoxaparin (LOVENOX) 150 MG/ML injection 0.9 mL (=126m = 1 mg/kg) [place] q12 hrs until directed by physician AND INR therapeutic 05/30/14   EShawnee Knapp MD  LORazepam (ATIVAN) 0.5 MG tablet Take 0.5 mg by mouth every 8 (eight) hours.    Historical Provider, MD  meloxicam (MOBIC) 7.5 MG tablet Take 1 tablet (7.5 mg total) by mouth daily. May take 2 if needed 05/29/14   JDarreld Mclean MD  phentermine 37.5 MG capsule Take 37.5 mg by mouth every morning.    Historical Provider, MD  warfarin (COUMADIN) 5 MG tablet Take 1 tablet (5 mg total) by mouth daily. 05/30/14   EShawnee Knapp MD    Family History Family History  Problem Relation Age of Onset  . Cancer Mother     multiple myeloma  . Hypertension Sister   . Cancer Maternal Grandmother   . Cancer Maternal Grandfather   . Hypertension Paternal Grandmother   . Cancer Paternal Grandfather     Social History Social History  Substance Use Topics  . Smoking status: Never Smoker  .  Smokeless tobacco: Never Used  . Alcohol use No     Allergies   Patient has no known allergies.   Review of Systems Review of Systems  Unable to perform ROS: Mental status change   LEVEL 5 CAVEAT- Full history could not be obtained due to AMS.   Physical Exam Updated Vital Signs BP 123/74 (BP Location: Left Arm)   Pulse (!) 57   Temp 98 F (36.7 C) (Rectal)   Resp 18   SpO2 100%   Physical Exam  Constitutional: She appears well-developed and well-nourished. No distress.  HENT:  Head: Normocephalic and atraumatic.  Nose: Nose normal.  Mouth/Throat: Oropharynx is clear and moist. No oropharyngeal exudate.  Eyes: Conjunctivae and EOM are normal. Pupils are equal, round, and reactive to light. No scleral icterus.    Neck: Neck supple. No JVD present. No tracheal deviation present. No thyromegaly present.  Cardiovascular: Regular rhythm and normal heart sounds.  Tachycardia present.  Exam reveals no gallop and no friction rub.   No murmur heard. Pulmonary/Chest: Effort normal and breath sounds normal. No respiratory distress. She has no wheezes. She exhibits no tenderness.  Abdominal: Soft. Bowel sounds are normal. She exhibits no distension and no mass. There is no tenderness. There is no rebound and no guarding.  Musculoskeletal: She exhibits no edema or tenderness.  Moves all four extremities.  Lymphadenopathy:    She has no cervical adenopathy.  Neurological: No cranial nerve deficit. She exhibits normal muscle tone.  Responds to painful stimuli. Does not follow commands.  Skin: Skin is warm and dry. No rash noted. No erythema. No pallor.  Nursing note and vitals reviewed.    ED Treatments / Results   DIAGNOSTIC STUDIES: Oxygen Saturation is 100% on RA, normal by my interpretation.   COORDINATION OF CARE: 3:35 AM- Will order labs, EKG and cardiac monitoring. Pt verbalizes understanding and agrees to plan.  Medications  sodium chloride 0.9 % bolus 2,000 mL (not administered)   Labs (all labs ordered are listed, but only abnormal results are displayed) Labs Reviewed  COMPREHENSIVE METABOLIC PANEL - Abnormal; Notable for the following:       Result Value   Glucose, Bld 153 (*)    All other components within normal limits  ACETAMINOPHEN LEVEL - Abnormal; Notable for the following:    Acetaminophen (Tylenol), Serum <10 (*)    All other components within normal limits  CBG MONITORING, ED - Abnormal; Notable for the following:    Glucose-Capillary 162 (*)    All other components within normal limits  I-STAT CHEM 8, ED - Abnormal; Notable for the following:    Glucose, Bld 151 (*)    All other components within normal limits  I-STAT CG4 LACTIC ACID, ED - Abnormal; Notable for the  following:    Lactic Acid, Venous 2.39 (*)    All other components within normal limits  CBC WITH DIFFERENTIAL/PLATELET  LIPASE, BLOOD  PROTIME-INR  RAPID URINE DRUG SCREEN, HOSP PERFORMED  ETHANOL  SALICYLATE LEVEL  MAGNESIUM  I-STAT TROPOININ, ED  POC URINE PREG, ED    EKG  EKG Interpretation  Date/Time:  Sunday September 28 2016 03:32:48 EST Ventricular Rate:  77 PR Interval:    QRS Duration: 84 QT Interval:  403 QTC Calculation: 457 R Axis:   62 Text Interpretation:  Sinus rhythm No significant change since last tracing Confirmed by Glynn Octave [phone]) on 09/28/2016 3:39:23 AM       Radiology No results found.  Procedures  Procedures (including critical care time)  Medications Ordered in ED Medications  sodium chloride 0.9 % bolus 2,000 mL (not administered)     Initial Impression / Assessment and Plan / ED Course  I have reviewed the triage vital signs and the nursing notes.  Pertinent labs & imaging results that were available during my care of the patient were reviewed by me and considered in my medical decision making (see chart for details).  Clinical Course     Patient presents to the emergency department for drug overdose and suicide attempt. She was initially tachycardic which resolved with IV fluids.  Labs are normal.  4hr tylenol level was obtained (ingestion was at 11:30pm) and it is normal.  Patient is normal awake and speaking to me clearly.  She is medically clear.  Safe for TTS consultation.     I personally performed the services described in this documentation, which was scribed in my presence. The recorded information has been reviewed and is accurate.     Final Clinical Impressions(s) / ED Diagnoses   Final diagnoses:  None    New Prescriptions New Prescriptions   No medications on file     Everlene Balls, MD 09/28/16 [phone]

## 2016-09-28 NOTE — ED Notes (Signed)
Poison control Contacted Recommendations as follows,  Hour observation and symptom management, Acetaminophen  Labs to be drawn 4 post ingestion, EKG, IVF. Observe for Tachycardia, Hallucinations, Agitation, and HTN.  Tachycardia Give IVF, HTN- Pressors, and for SZ Benzo secondary Phenobarbital.

## 2016-09-28 NOTE — ED Notes (Signed)
SBAR Report received from previous nurse. Pt received calm and visible on unit. Pt denies current HI, A/V H, anxiety, or pain at this time, and appears otherwise stable and free of distress. Pt endorses SI and depression. Pt reminded of camera surveillance, q 15 min rounds, and rules of the milieu. Will continue to assess.

## 2016-09-28 NOTE — ED Notes (Signed)
Orders received to discharge patient. Pt aware and discharge summery reviewed with patient as well as review of medications. All personal items return and patient signed all discharge paperwork. Pt escorted off unit.  

## 2016-09-28 NOTE — ED Triage Notes (Signed)
Pt brought in by EMS, pt was found in home by spouse vomiting with diarrhea, Pt reports around  1130 pm  On 09/27/16, pt drank a bottle of Robitussin and 10 tablets of 45 mg of mirtazapine, as well as an unknown amount of Vodka. Pt has SI and told EMS workers she has been having some hard times and want to be with her deceased mother.

## 2016-09-28 NOTE — B[facility] Counselor (Signed)
[facility] Assessment Progress Note  [facility] Julieanne Cottonina, AC. Pt can come after 1900. Call report to [phone]. Pt's RN, Rayfield Citizenaroline, notified.   Johny ShockSamantha M. Ladona Ridgelaylor, MS, NCC, LPCA Counselor

## 2016-09-28 NOTE — BH Assessment (Signed)
Attempted to assess patient. Patient was able to provide her first and last name. Patient spoke softly and very slowly. Patient states that she is nauseous and unable to answer questions at this time. Informed Dr. Mora Bellmanni.   Teresa PokeJoVea Saylor Sheckler, LCSW Therapeutic Triage Specialist [facility] 09/28/2016 6:58 AM

## 2016-09-28 NOTE — ED Notes (Signed)
[facility]  Patient will go to room 403-2.

## 2016-09-28 NOTE — ED Notes (Signed)
Poison control cleared patient 

## 2016-09-28 NOTE — ED Notes (Signed)
[facility] [place] to go with sitter to room 32

## 2016-09-28 NOTE — BH Assessment (Addendum)
Assessment Note  Teresa Hunter is a 50 y.o. female who presented to [facility] voluntarily due to a suicide attempt via int'l overdose. Pt indicated to her husband that she took 10 mirtazapine tablets, drank an entire bottle of robitussin and drank an unknown amount of vodka.  Pt admitted to clinician that this was a suicide attempt. Pt disclosed that she is not able to have children and her husband has been seeing someone else and indicated that he still wants biological children. Pt also shared that her mother died during the holidays and this time of the year is always hard for her due to this. Pt indicated that she just felt overwhelmed with all that's going on in her life and wanted to have some peace. Pt sees a psychiatrist and has been prescribed Cymbalta, but is not medication compliant. Pt had one prior suicide attempt by overdose at the same time of the year in 2015. She didn't have IP hospitalization at that time. Pt denies HI, AVH. Pt is open to IP treatment.   Diagnosis: MDD, recurrent episode, severe  Past Medical History:  Past Medical History:  Diagnosis Date  . Allergy   . Anxiety   . Asthma   . Family history of anesthesia complication    pt's mother slow to wake up   . GERD (gastroesophageal reflux disease)   . Hypertension 2005   stress related /weight fluctuates.   . Insomnia   . Migraine headache     Past Surgical History:  Procedure Laterality Date  . CHOLECYSTECTOMY  08/06/2012   Procedure: LAPAROSCOPIC CHOLECYSTECTOMY;  Surgeon: Harl Bowie, MD;  Location: [facility];  Service: General;  Laterality: N/A;  . COLONOSCOPY  2013 october- Dr Collene Mares   removal of polyps with endoscopy /colonoscopy  . EYE SURGERY     laser  . laser     B/L glaucoma  . TONSILLECTOMY      Family History:  Family History  Problem Relation Age of Onset  . Cancer Mother     multiple myeloma  . Hypertension Sister   . Cancer Maternal Grandmother   . Cancer Maternal Grandfather   .  Hypertension Paternal Grandmother   . Cancer Paternal Grandfather     Social History:  reports that she has never smoked. She has never used smokeless tobacco. She reports that she does not drink alcohol or use drugs.  Additional Social History:  Alcohol / Drug Use Pain Medications: see PTA meds Prescriptions: see PTA meds Over the Counter: see PTA meds History of alcohol / drug use?: No history of alcohol / drug abuse  CIWA: CIWA-Ar BP: 115/67 Pulse Rate: 94 COWS:    Allergies: No Known Allergies  Home Medications:  (Not in a hospital admission)  OB/GYN Status:  No LMP recorded. Patient is not currently having periods (Reason: Perimenopausal).  General Assessment Data Location of Assessment: [facility] ED TTS Assessment: In system Is this a Tele or Face-to-Face Assessment?: Face-to-Face Is this an Initial Assessment or a Re-assessment for this encounter?: Initial Assessment Marital status: Married Is patient pregnant?: No Pregnancy Status: No Living Arrangements: Spouse/significant other Can pt return to current living arrangement?: Yes Admission Status: Voluntary Is patient capable of signing voluntary admission?: Yes Referral Source: Self/Family/Friend     Crisis Care Plan Living Arrangements: Spouse/significant other Name of Psychiatrist: Unknown Jim Name of Therapist: none  Education Status Is patient currently in school?: No  Risk to self with the past 6 months Suicidal Ideation: Yes-Currently Present Has  patient been a risk to self within the past 6 months prior to admission? : No Suicidal Intent: Yes-Currently Present Has patient had any suicidal intent within the past 6 months prior to admission? : No Is patient at risk for suicide?: Yes Suicidal Plan?: Yes-Currently Present Has patient had any suicidal plan within the past 6 months prior to admission? : No Specify Current Suicidal Plan: pt had an int'l overdose Access to Means: Yes Specify Access to  Suicidal Means: pt has access to pills What has been your use of drugs/alcohol within the last 12 months?: no use Previous Attempts/Gestures: Yes How many times?: 1 Triggers for Past Attempts: Other (Comment) (see narrative) Intentional Self Injurious Behavior: None Family Suicide History: No Recent stressful life event(s):  (see narrative) Persecutory voices/beliefs?: No Depression: Yes Depression Symptoms: Tearfulness, Feeling worthless/self pity Substance abuse history and/or treatment for substance abuse?: No Suicide prevention information given to non-admitted patients: Not applicable  Risk to Others within the past 6 months Homicidal Ideation: No Does patient have any lifetime risk of violence toward others beyond the six months prior to admission? : No Thoughts of Harm to Others: No Current Homicidal Intent: No Current Homicidal Plan: No Access to Homicidal Means: No History of harm to others?: No Assessment of Violence: None Noted Does patient have access to weapons?: No Criminal Charges Pending?: No Does patient have a court date: No Is patient on probation?: No  Psychosis Hallucinations: None noted Delusions: None noted  Mental Status Report Appearance/Hygiene: Unremarkable Eye Contact: Fair Motor Activity: Unremarkable Speech: Logical/coherent Level of Consciousness: Alert, Quiet/awake Mood: Sad Affect: Appropriate to circumstance Anxiety Level: None Thought Processes: Coherent, Relevant Judgement: Impaired Orientation: Person, Place, Time, Situation, Appropriate for developmental age Obsessive Compulsive Thoughts/Behaviors: None  Cognitive Functioning Concentration: Normal Memory: Recent Intact, Remote Intact IQ: Average Insight: Fair Impulse Control: Fair Appetite: Good Sleep: No Change Vegetative Symptoms: None  ADLScreening [facility] Assessment Services) Patient's cognitive ability adequate to safely complete daily activities?: Yes Patient able to  express need for assistance with ADLs?: Yes Independently performs ADLs?: Yes (appropriate for developmental age)  Prior Inpatient Therapy Prior Inpatient Therapy: No  Prior Outpatient Therapy Prior Outpatient Therapy: No Does patient have an ACCT team?: No Does patient have Intensive In-House Services?  : No Does patient have [facility] services? : No Does patient have P4CC services?: No  ADL Screening (condition at time of admission) Patient's cognitive ability adequate to safely complete daily activities?: Yes Is the patient deaf or have difficulty hearing?: No Does the patient have difficulty seeing, even when wearing glasses/contacts?: No Does the patient have difficulty concentrating, remembering, or making decisions?: No Patient able to express need for assistance with ADLs?: Yes Does the patient have difficulty dressing or bathing?: No Independently performs ADLs?: Yes (appropriate for developmental age) Does the patient have difficulty walking or climbing stairs?: No Weakness of Legs: None Weakness of Arms/Hands: None  Home Assistive Devices/Equipment Home Assistive Devices/Equipment: None    Abuse/Neglect Assessment (Assessment to be complete while patient is alone) Physical Abuse: Denies Verbal Abuse: Denies Sexual Abuse: Denies Exploitation of patient/patient's resources: Denies Self-Neglect: Denies Values / Beliefs Spiritual Requests During Hospitalization: None Consults Spiritual Care Consult Needed: No Social Work Consult Needed: No Regulatory affairs officer (For Healthcare) Does Patient Have a Medical Advance Directive?: No Would patient like information on creating a medical advance directive?: No - Patient declined    Additional Information 1:1 In Past 12 Months?: No CIRT Risk: No Elopement Risk: No Does patient have  medical clearance?: Yes     Disposition:  Disposition Initial Assessment Completed for this Encounter: Yes (consulted with Dr. Darleene Cleaver &  Waylan Boga, DNP) Disposition of Patient: Inpatient treatment program Type of inpatient treatment program: Adult  On Site Evaluation by:   Reviewed with Physician:    Rexene Edison 09/28/2016 10:03 AM

## 2016-09-29 ENCOUNTER — Encounter (HOSPITAL_COMMUNITY): Payer: Self-pay | Admitting: Psychiatry

## 2016-09-29 DIAGNOSIS — Z808 Family history of malignant neoplasm of other organs or systems: Secondary | ICD-10-CM

## 2016-09-29 DIAGNOSIS — Z9889 Other specified postprocedural states: Secondary | ICD-10-CM

## 2016-09-29 DIAGNOSIS — Z79899 Other long term (current) drug therapy: Secondary | ICD-10-CM

## 2016-09-29 DIAGNOSIS — Z888 Allergy status to other drugs, medicaments and biological substances status: Secondary | ICD-10-CM

## 2016-09-29 DIAGNOSIS — R45851 Suicidal ideations: Secondary | ICD-10-CM

## 2016-09-29 DIAGNOSIS — F332 Major depressive disorder, recurrent severe without psychotic features: Principal | ICD-10-CM

## 2016-09-29 DIAGNOSIS — Z8249 Family history of ischemic heart disease and other diseases of the circulatory system: Secondary | ICD-10-CM

## 2016-09-29 MED ORDER — BUSPIRONE HCL 15 MG PO TABS
7.5000 mg | ORAL_TABLET | Freq: Two times a day (BID) | ORAL | Status: DC
  Administered 2016-09-30 – 2016-10-06 (×13): 7.5 mg via ORAL
  Filled 2016-09-29 (×15): qty 1

## 2016-09-29 MED ORDER — HYDROXYZINE HCL 50 MG PO TABS
50.0000 mg | ORAL_TABLET | Freq: Two times a day (BID) | ORAL | Status: DC | PRN
  Administered 2016-09-30 – 2016-10-07 (×8): 50 mg via ORAL
  Filled 2016-09-29 (×8): qty 1

## 2016-09-29 NOTE — Tx Team (Signed)
Initial Treatment Plan 09/29/2016 12:05 AM Teresa Hunter [id number]    PATIENT STRESSORS: Marital or family conflict Medication change or noncompliance   PATIENT STRENGTHS: Ability for insight Average or above average intelligence Capable of independent living Communication skills General fund of knowledge Motivation for treatment/growth   PATIENT IDENTIFIED PROBLEMS: Depression Suicidal thoughts "How to deal with a lot of stress and anxiety and issues I'm still trying to deal with"                     DISCHARGE CRITERIA:  Ability to meet basic life and health needs Improved stabilization in mood, thinking, and/or behavior Verbal commitment to aftercare and medication compliance  PRELIMINARY DISCHARGE PLAN: Attend aftercare/continuing care group Return to previous living arrangement  PATIENT/FAMILY INVOLVEMENT: This treatment plan has been presented to and reviewed with the patient, Teresa Hunter, and/or family member, .  The patient and family have been given the opportunity to ask questions and make suggestions.  Teresa Hunter, [name], [place]RN 09/29/2016, 12:05 AM

## 2016-09-29 NOTE — Progress Notes (Signed)
51 year old female pt admitted on voluntary basis. Teresa Hunter, on admission, reports that she recently tried to overdose on some of her medications and her husband came home and found her and called 911 which is how she ended up in hospital. Teresa Hunter shared that she has on-going stress and anxiety and is having difficulties dealing with various issues that she is going through. She reports she is prescribed medication but reports she does not take it like she is suppose to. She does endorse passive SI but is able to contract for safety while in the hospital. Teresa Hunter reports that she will go home with either her husband or sister after discharge and does report a safe place to go. Teresa Hunter was oriented to the unit and safety maintained.

## 2016-09-29 NOTE — Progress Notes (Addendum)
Adult Psychoeducational Group Note  Date:  09/29/2016 Time:  8:00 PM   Group Topic/Focus:  Wrap-Up Group:   The focus of this group is to help patients review their daily goal of treatment and discuss progress on daily workbooks.   Participation Level:  Appropriate  Participation Quality: Good   Affect:  Appropriate  Cognitive:  Alert  Insight: Appropriate  Engagement in Group:  Appropriate  Modes of Intervention:  Discussion and Education  Additional Comments:   Patient reported working on "feeling positive."  Patient reported meeting new individuals and listening to help her cope.  Elmore GuiseSLOAN, [place] Murthy N 09/29/2016, 9:13 PM

## 2016-09-29 NOTE — [facility] Counselor (Signed)
Adult Comprehensive Assessment  Patient ID: Teresa NoonLavonda Roh, female   DOB: June 13, 1966, 51 y.o.   MRN: [id number]  Information Source: Information source: Patient  Current Stressors:  Educational / Learning stressors: college degree in nursing Employment / Job issues: currently employed as travel Engineer, civil (consulting)nurse, has [place] offered [place] [facility] [employer] Family Relationships: conflict w husband who may take "another wife" in order to have children Financial / Lack of resources (include bankruptcy): no concerns expressed Housing / Lack of housing: lives w husband, hoping to buy house Physical health (include injuries & life threatening diseases): no concerns expressed  Social relationships: is not forthcoming w friends/family re depth of her depression, avoids contact w others Substance abuse: denies Bereavement / Loss: still grieving loss of mother in 2002 and grandmother in 2000; were both supportive "strong women" in family  Living/Environment/Situation:  Living Arrangements: Spouse/significant other Living conditions (as described by patient or guardian): lives w husband in apartment How long has patient lived in current situation?: months/years since return from husband's family's home in [place] What is atmosphere in current home: Supportive (conflicted over husband's desire to take "another wife" who is younger in order to have children)  Family History:  Marital status: Married Number of Years Married: 11 What types of issues is patient dealing with in the relationship?: sought to have children via IVF due to patient's early menopause; were told by religious leader that this was not permitted, feels husband is not supportive of her and makes her feel inadequate because she cannot have chlidren w him Additional relationship information: relationship strained w husband Are you sexually active?: Yes What is your sexual orientation?: heterosexual Has your sexual activity been affected by drugs,  alcohol, medication, or emotional stress?: see above Does patient have children?: No (has stepdaughter from previous relationship who is not her biological child and does not live w her)  Childhood History:  By whom was/is the patient raised?: Both parents Additional childhood history information: raised at grandmother's house while [place] was [place] Description of patient's relationship with caregiver when they were a child: good w mother and grandmother, distant w father Patient's description of current relationship with people who raised him/her: mother deceased, pt is often caregiver for father who is disabled w COPD How were you disciplined when you got in trouble as a child/adolescent?: unknown Does patient have siblings?: Yes Number of Siblings: 2 Description of patient's current relationship with siblings: younger brother and sister, was often the caregiver for them, "I was never allowed to be a child because I was the oldest" Did patient suffer any verbal/emotional/physical/sexual abuse as a child?: No (felt the way she was raised was constraining, was a Associate Professor"rule follower") Did patient suffer from severe childhood neglect?: No Has patient ever been sexually abused/assaulted/raped as an adolescent or adult?: No Was the patient ever a victim of a crime or a disaster?: No Witnessed domestic violence?: No Has patient been effected by domestic violence as an adult?: No  Education:  Highest grade of school patient has completed: college degree in nursing Currently a student?: No Learning disability?: No  Employment/Work Situation:   Employment situation: Employed 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 How long has patient been employed?: 6 months at current job Patient's job has been impacted by current illness: Yes Describe how patient's job has been impacted: very anxious when leaving house for work, less enthusiasm/interest in job than in the past What is  the longest time patient has a  held a job?: 10 years Where was the patient employed at that time?: nursing company Has patient ever been in the [employer]?: No Has patient ever served in combat?: No Did You Receive Any Psychiatric Treatment/Services While in Equities trader?: No Are There Guns or Other Weapons in Your Home?: No  Financial Resources:   Financial resources:  (Pt says she used to have [employer] and has recently regained coverage) Does patient have a Lawyer or guardian?: No  Alcohol/Substance Abuse:   What has been your use of drugs/alcohol within the last 12 months?: denies all use If attempted suicide, did drugs/alcohol play a role in this?: No Alcohol/Substance Abuse Treatment Hx: Denies past history Has alcohol/substance abuse ever caused legal problems?: No  Social Support System:   Conservation officer, nature Support System: Fair Museum/gallery exhibitions officer System: values husbands extended family in [place], does not inform family/friends of depth of her depression and its impact on her activities and daily life, often stays home rather than reaching out for support Type of faith/religion: Muslim How does patient's faith help to cope with current illness?: converted from Christianity, finds peace in prayer and community of other women  Leisure/Recreation:   Leisure and Hobbies: caregiving, does not go out often  Strengths/Needs:   What things does the patient do well?: caregiver - "I take care of everyone else" In what areas does patient struggle / problems for patient: self care, being honest about what she needs and help she needs w depression  Discharge Plan:   Does patient have access to transportation?: Yes Will patient be returning to same living situation after discharge?: Yes Currently receiving community mental health services: No (saw therapist Halimena Creque in past and would like to return at discharge, could be referred for medications management  in same practice if needed) If no, would patient like referral for services when discharged?: Yes (What county?) (see above) Does patient have financial barriers related to discharge medications?: No  Summary/Recommendations:   Summary and Recommendations (to be completed by the evaluator): Patient is a 51 year old female, admitted voluntarily after intentional overdose, diagnosed w Major Depressive Disorder at admission.  Stressor prior to admission include relationship conflict/stress.  Currently employed as Engineer, civil (consulting).  Received mental health services from therapist in past and would like to resume at discharge.  Patient will benefit from hospitalization for crisis stabilization, medication management, group psychotherapy and psychoeducation.  Goals of hospitalization include elimination of suicidal ideation, increased mood stability, emotion regulation and coping skills.    Sallee Lange. 09/29/2016

## 2016-09-29 NOTE — H&P (Signed)
Psychiatric Admission Assessment Adult  Patient Identification: Teresa Hunter MRN:  [id number] Date of Evaluation:  09/29/2016 Chief Complaint:  MDD, Recurrent, Episode Severe Principal Diagnosis: MDD (major depressive disorder), recurrent severe, without psychosis ([place]) Diagnosis:   Patient Active Problem List   Diagnosis Date Noted  . MDD (major depressive disorder), recurrent severe, without psychosis ([place]) [F33.2] 09/28/2016    Priority: High  . Severe recurrent major depression without psychotic features ([place]) [F33.2] 09/28/2016  . Chronic cholecystitis [K81.1] 07/14/2012   History of Present Illness:  Per TTS notes: "Teresa Hunter is a 51 y.o. female who presented to [facility] voluntarily due to a suicide attempt via int'l overdose. Pt indicated to her husband that she took 10 mirtazapine tablets, drank an entire bottle of robitussin and drank an unknown amount of vodka.  Pt admitted to clinician that this was a suicide attempt. Pt disclosed that she is not able to have children and her husband has been seeing someone else and indicated that he still wants biological children. Pt also shared that her mother died during the holidays and this time of the year is always hard for her due to this. Pt indicated that she just felt overwhelmed with all that's going on in her life and wanted to have some peace. Pt sees a psychiatrist and has been prescribed Cymbalta, but is not medication compliant. Pt had one prior suicide attempt by overdose at the same time of the year in 2015. She didn't have IP hospitalization at that time. Pt denies HI, AVH."  Today on 09/29/16, pt seen and chart reviewed for H&P. Pt is alert/oriented x4, calm, cooperative, and appropriate to situation. Pt denies homicidal ideation and psychosis and does not appear to be responding to internal stimuli. Pt reports that she has been struggling with anxiety and depression related to her job and other financial concerns. Pt reports that  this in addition to chronic infertility (told she can't have children) has been her primary source of depression and despair.   Associated Signs/Symptoms: Depression Symptoms:  depressed mood, anhedonia, insomnia, feelings of worthlessness/guilt, difficulty concentrating, hopelessness, impaired memory, recurrent thoughts of death, suicidal attempt, (Hypo) Manic Symptoms:  Labiality of Mood, Anxiety Symptoms:  Excessive Worry, Psychotic Symptoms:  denies PTSD Symptoms: NA Total Time spent with patient: 45 minutes  Past Psychiatric History: depression, anxiety  Is the patient at risk to self? Yes.    Has the patient been a risk to self in the past 6 months? Yes.    Has the patient been a risk to self within the distant past? Yes.    Is the patient a risk to others? No.  Has the patient been a risk to others in the past 6 months? No.  Has the patient been a risk to others within the distant past? No.   Prior Inpatient Therapy:   Prior Outpatient Therapy:    Alcohol Screening: 1. How often do you have a drink containing alcohol?: Monthly or less 2. How many drinks containing alcohol do you have on a typical day when you are drinking?: 1 or 2 3. How often do you have six or more drinks on one occasion?: Never Preliminary Score: 0 9. Have you or someone else been injured as a result of your drinking?: No 10. Has a relative or friend or a doctor or another health worker been concerned about your drinking or suggested you cut down?: No Alcohol Use Disorder Identification Test Final Score (AUDIT): 1 Brief Intervention: AUDIT score less than  7 or less-screening does not suggest unhealthy drinking-brief intervention not indicated Substance Abuse History in the last 12 months:  No. Consequences of Substance Abuse: NA Previous Psychotropic Medications: Yes  Psychological Evaluations: Yes  Past Medical History:  Past Medical History:  Diagnosis Date  . Allergy   . Anxiety   .  Asthma   . Family history of anesthesia complication    pt's mother slow to wake up   . GERD (gastroesophageal reflux disease)   . Hypertension 2005   stress related /weight fluctuates.   . Insomnia   . Migraine headache     Past Surgical History:  Procedure Laterality Date  . CHOLECYSTECTOMY  08/06/2012   Procedure: LAPAROSCOPIC CHOLECYSTECTOMY;  Surgeon: Harl Bowie, MD;  Location: [facility];  Service: General;  Laterality: N/A;  . COLONOSCOPY  2013 october- Dr Collene Mares   removal of polyps with endoscopy /colonoscopy  . EYE SURGERY     laser  . laser     B/L glaucoma  . TONSILLECTOMY     Family History:  Family History  Problem Relation Age of Onset  . Cancer Mother     multiple myeloma  . Hypertension Sister   . Cancer Maternal Grandmother   . Cancer Maternal Grandfather   . Hypertension Paternal Grandmother   . Cancer Paternal Grandfather    Family Psychiatric  History: denies Tobacco Screening: Have you used any form of tobacco in the last 30 days? (Cigarettes, Smokeless Tobacco, Cigars, and/or Pipes): No Social History:  History  Alcohol Use No     History  Drug Use No    Additional Social History:                           Allergies:   Allergies  Allergen Reactions  . Metformin And Related Nausea And Vomiting   Lab Results:  Results for orders placed or performed during the hospital encounter of 09/28/16 (from the past 48 hour(s))  CBG monitoring, ED     Status: Abnormal   Collection Time: 09/28/16  3:37 AM  Result Value Ref Range   Glucose-Capillary 162 (H) 65 - 99 mg/dL  POC urine preg, ED (not at [facility])     Status: None   Collection Time: 09/28/16  3:58 AM  Result Value Ref Range   Preg Test, Ur NEGATIVE NEGATIVE    Comment:        THE SENSITIVITY OF THIS METHODOLOGY IS >24 mIU/mL   Rapid urine drug screen (hospital performed)     Status: None   Collection Time: 09/28/16  3:58 AM  Result Value Ref Range   Opiates NONE DETECTED NONE  DETECTED   Cocaine NONE DETECTED NONE DETECTED   Benzodiazepines NONE DETECTED NONE DETECTED   Amphetamines NONE DETECTED NONE DETECTED   Tetrahydrocannabinol NONE DETECTED NONE DETECTED   Barbiturates NONE DETECTED NONE DETECTED    Comment:        DRUG SCREEN FOR MEDICAL PURPOSES ONLY.  IF CONFIRMATION IS NEEDED FOR ANY PURPOSE, NOTIFY LAB WITHIN 5 DAYS.        LOWEST DETECTABLE LIMITS FOR URINE DRUG SCREEN Drug Class       Cutoff (ng/mL) Amphetamine      1000 Barbiturate      200 Benzodiazepine   846 Tricyclics       659 Opiates          300 Cocaine          300  THC              50   Ethanol     Status: None   Collection Time: 09/28/16  4:24 AM  Result Value Ref Range   Alcohol, Ethyl (B) <5 <5 mg/dL    Comment:        LOWEST DETECTABLE LIMIT FOR SERUM ALCOHOL IS 5 mg/dL FOR MEDICAL PURPOSES ONLY   Acetaminophen level     Status: Abnormal   Collection Time: 09/28/16  4:24 AM  Result Value Ref Range   Acetaminophen (Tylenol), Serum <10 (L) 10 - 30 ug/mL    Comment:        THERAPEUTIC CONCENTRATIONS VARY SIGNIFICANTLY. A RANGE OF 10-30 ug/mL MAY BE AN EFFECTIVE CONCENTRATION FOR MANY PATIENTS. HOWEVER, SOME ARE BEST TREATED AT CONCENTRATIONS OUTSIDE THIS RANGE. ACETAMINOPHEN CONCENTRATIONS >150 ug/mL AT 4 HOURS AFTER INGESTION AND >50 ug/mL AT 12 HOURS AFTER INGESTION ARE OFTEN ASSOCIATED WITH TOXIC REACTIONS.   Salicylate level     Status: None   Collection Time: 09/28/16  4:24 AM  Result Value Ref Range   Salicylate Lvl <2.8 2.8 - 30.0 mg/dL  CBC with Differential/Platelet     Status: None   Collection Time: 09/28/16  4:26 AM  Result Value Ref Range   WBC 5.9 4.0 - 10.5 K/uL   RBC 4.59 3.87 - 5.11 MIL/uL   Hemoglobin 13.3 12.0 - 15.0 g/dL   HCT 40.1 36.0 - 46.0 %   MCV 87.4 78.0 - 100.0 fL   MCH 29.0 26.0 - 34.0 pg   MCHC 33.2 30.0 - 36.0 g/dL   RDW 13.9 11.5 - 15.5 %   Platelets 263 150 - 400 K/uL   Neutrophils Relative % 52 %   Neutro Abs 3.1  1.7 - 7.7 K/uL   Lymphocytes Relative 38 %   Lymphs Abs 2.2 0.7 - 4.0 K/uL   Monocytes Relative 7 %   Monocytes Absolute 0.4 0.1 - 1.0 K/uL   Eosinophils Relative 2 %   Eosinophils Absolute 0.1 0.0 - 0.7 K/uL   Basophils Relative 1 %   Basophils Absolute 0.0 0.0 - 0.1 K/uL  Comprehensive metabolic panel     Status: Abnormal   Collection Time: 09/28/16  4:26 AM  Result Value Ref Range   Sodium 142 135 - 145 mmol/L   Potassium 4.0 3.5 - 5.1 mmol/L   Chloride 107 101 - 111 mmol/L   CO2 26 22 - 32 mmol/L   Glucose, Bld 153 (H) 65 - 99 mg/dL   BUN 10 6 - 20 mg/dL   Creatinine, Ser 0.85 0.44 - 1.00 mg/dL   Calcium 9.4 8.9 - 10.3 mg/dL   Total Protein 7.9 6.5 - 8.1 g/dL   Albumin 4.3 3.5 - 5.0 g/dL   AST 24 15 - 41 U/L   ALT 20 14 - 54 U/L   Alkaline Phosphatase 98 38 - 126 U/L   Total Bilirubin 0.4 0.3 - 1.2 mg/dL   GFR calc non Af Amer >60 >60 mL/min   GFR calc Af Amer >60 >60 mL/min    Comment: (NOTE) The eGFR has been calculated using the CKD EPI equation. This calculation has not been validated in all clinical situations. eGFR's persistently <60 mL/min signify possible Chronic Kidney Disease.    Anion gap 9 5 - 15  Lipase, blood     Status: None   Collection Time: 09/28/16  4:26 AM  Result Value Ref Range   Lipase 34 11 - 51  U/L  Protime-INR     Status: None   Collection Time: 09/28/16  4:26 AM  Result Value Ref Range   Prothrombin Time 13.0 11.4 - 15.2 seconds   INR 0.98   Magnesium     Status: None   Collection Time: 09/28/16  4:26 AM  Result Value Ref Range   Magnesium 1.9 1.7 - 2.4 mg/dL  I-stat troponin, ED     Status: None   Collection Time: 09/28/16  4:36 AM  Result Value Ref Range   Troponin i, poc 0.00 0.00 - 0.08 ng/mL   Comment 3            Comment: Due to the release kinetics of cTnI, a negative result within the first hours of the onset of symptoms does not rule out myocardial infarction with certainty. If myocardial infarction is still  suspected, repeat the test at appropriate intervals.   I-stat chem 8, ed     Status: Abnormal   Collection Time: 09/28/16  4:38 AM  Result Value Ref Range   Sodium 142 135 - 145 mmol/L   Potassium 3.9 3.5 - 5.1 mmol/L   Chloride 104 101 - 111 mmol/L   BUN 8 6 - 20 mg/dL   Creatinine, Ser 0.80 0.44 - 1.00 mg/dL   Glucose, Bld 151 (H) 65 - 99 mg/dL   Calcium, Ion 1.17 1.15 - 1.40 mmol/L   TCO2 25 0 - 100 mmol/L   Hemoglobin 12.9 12.0 - 15.0 g/dL   HCT 38.0 36.0 - 46.0 %  I-Stat CG4 Lactic Acid, ED     Status: Abnormal   Collection Time: 09/28/16  4:38 AM  Result Value Ref Range   Lactic Acid, Venous 2.39 (HH) 0.5 - 1.9 mmol/L   Comment NOTIFIED PHYSICIAN   I-Stat CG4 Lactic Acid, ED     Status: None   Collection Time: 09/28/16  7:40 AM  Result Value Ref Range   Lactic Acid, Venous 0.90 0.5 - 1.9 mmol/L    Blood Alcohol level:  Lab Results  Component Value Date   ETH <5 27/78/2423    Metabolic Disorder Labs:  No results found for: HGBA1C, MPG No results found for: PROLACTIN No results found for: CHOL, TRIG, HDL, CHOLHDL, VLDL, LDLCALC  Current Medications: Current Facility-Administered Medications  Medication Dose Route Frequency Provider Last Rate Last Dose  . acetaminophen (TYLENOL) tablet 650 mg  650 mg Oral Q6H PRN Patrecia Pour, NP   650 mg at 09/29/16 0905  . albuterol (PROVENTIL HFA;VENTOLIN HFA) 108 (90 Base) MCG/ACT inhaler 1-2 puff  1-2 puff Inhalation Q6H PRN Patrecia Pour, NP      . albuterol (PROVENTIL) (2.5 MG/3ML) 0.083% nebulizer solution 3 mL  3 mL Inhalation Q6H PRN Patrecia Pour, NP      . alum & mag hydroxide-simeth (MAALOX/MYLANTA) 200-200-20 MG/5ML suspension 30 mL  30 mL Oral Q4H PRN Patrecia Pour, NP      . gabapentin (NEURONTIN) capsule 300 mg  300 mg Oral QHS Patrecia Pour, NP      . magnesium hydroxide (MILK OF MAGNESIA) suspension 30 mL  30 mL Oral Daily PRN Patrecia Pour, NP      . sertraline (ZOLOFT) tablet 50 mg  50 mg Oral Daily  Patrecia Pour, NP   50 mg at 09/29/16 0905  . traZODone (DESYREL) tablet 50 mg  50 mg Oral QHS,MR X 1 Rozetta Nunnery, NP   50 mg at 09/29/16 0017   PTA  Medications: Prescriptions Prior to Admission  Medication Sig Dispense Refill Last Dose  . albuterol (PROVENTIL HFA;VENTOLIN HFA) 108 (90 BASE) MCG/ACT inhaler Inhale 1-2 puffs into the lungs every 6 (six) hours as needed for wheezing or shortness of breath. (Patient not taking: Reported on 09/28/2016) 1 Inhaler 0 Not Taking at Unknown time  . clonazePAM (KLONOPIN) 0.5 MG tablet Take 0.25 mg by mouth daily as needed for anxiety.   Past Week at Unknown time  . dicyclomine (BENTYL) 20 MG tablet Take 20 mg by mouth daily.   Past Month at Unknown time  . enoxaparin (LOVENOX) 150 MG/ML injection 0.9 mL (=151m = 1 mg/kg) [place] q12 hrs until directed by physician AND INR therapeutic (Patient not taking: Reported on 09/28/2016) 14 mL 1 Not Taking at Unknown time  . ibuprofen (ADVIL,MOTRIN) 200 MG tablet Take 400 mg by mouth every 8 (eight) hours as needed for headache.   Past Week at Unknown time  . meloxicam (MOBIC) 7.5 MG tablet Take 1 tablet (7.5 mg total) by mouth daily. May take 2 if needed (Patient not taking: Reported on 09/28/2016) 60 tablet 1 Not Taking at Unknown time  . Multiple Vitamins-Minerals (THERA-M) TABS Take 1 tablet by mouth daily.   Past Month at Unknown time  . phentermine 37.5 MG capsule Take 37.5 mg by mouth every morning.   Not Taking at Unknown time  . warfarin (COUMADIN) 5 MG tablet Take 1 tablet (5 mg total) by mouth daily. (Patient not taking: Reported on 09/28/2016) 30 tablet 0 Not Taking at Unknown time    Musculoskeletal: Strength & Muscle Tone: within normal limits Gait & Station: normal Patient leans: N/A  Psychiatric Specialty Exam: Physical Exam  Constitutional: She appears well-nourished.    Review of Systems  Psychiatric/Behavioral: Positive for depression and suicidal ideas. Negative for hallucinations and  substance abuse. The patient is nervous/anxious and has insomnia.   All other systems reviewed and are negative.   Blood pressure (!) 131/91, pulse 96, temperature 98.3 F (36.8 C), temperature source Oral, resp. rate 16, height _0  (1.727 m), weight 88.5 kg (195 lb).Body mass index is 29.65 kg/m.  General Appearance: Casual and Fairly Groomed  Eye Contact:  Fair  Speech:  Clear and Coherent and Normal Rate  Volume:  Normal  Mood:  Anxious and Depressed  Affect:  Appropriate, Congruent and Depressed  Thought Process:  Coherent, Goal Directed, Linear and Descriptions of Associations: Intact  Orientation:  Full (Time, Place, and Person)  Thought Content:  Focused on feeling worthless and helpless  Suicidal Thoughts:  Yes, with overdose  Homicidal Thoughts:  No  Memory:  Immediate;   Fair Recent;   Fair Remote;   Fair  Judgement:  Fair  Insight:  Fair  Psychomotor Activity:  Normal  Concentration:  Concentration: Fair and Attention Span: Fair  Recall:  [employer] Knowledge:  Fair  Language:  Fair  Akathisia:  No  Handed:    AIMS (if indicated):     Assets:  Communication Skills Resilience Social Support  ADL's:  Intact  Cognition:  WNL  Sleep:  Number of Hours: 4.25   Treatment Plan Summary: MDD (major depressive disorder), recurrent severe, without psychosis ([place] unstable, with overdose, managed as below:  Medications:  -Start buspar 7.528mpo bid for anxiety -Continue neurontin 30071mo qhs for neuropathic pain/insomnia -Trazodone 65m22m qhs rpt x1 prn insomnia -Continue zoloft 65mg63mdaily for depression  Labs: -Reviewed CBC, CMP, UDS, UA, EKG. Glucose 151H,  Lactic acid 2.39H (trending down), TSH is wnl, [id number]  Physician Treatment Plan for Primary Diagnosis: MDD (major depressive disorder), recurrent severe, without psychosis ([place]) Long Term Goal(s): Improvement in symptoms so as ready for discharge  Short Term Goals: Ability to identify changes in  lifestyle to reduce recurrence of condition will improve, Ability to verbalize feelings will improve, Ability to disclose and discuss suicidal ideas, Ability to demonstrate self-control will improve, Ability to identify and develop effective coping behaviors will improve, Ability to maintain clinical measurements within normal limits will improve and Compliance with prescribed medications will improve  Physician Treatment Plan for Secondary Diagnosis: Active Problems:   MDD (major depressive disorder), recurrent severe, without psychosis ([place])  Long Term Goal(s): Improvement in symptoms so as ready for discharge  Short Term Goals: Ability to identify changes in lifestyle to reduce recurrence of condition will improve, Ability to verbalize feelings will improve, Ability to disclose and discuss suicidal ideas, Ability to demonstrate self-control will improve, Ability to identify and develop effective coping behaviors will improve, Ability to maintain clinical measurements within normal limits will improve and Compliance with prescribed medications will improve  I certify that inpatient services furnished can reasonably be expected to improve the patient's condition.    Benjamine Mola, FNP 1/1/201811:31 AM

## 2016-09-29 NOTE — [facility] Suicide Risk Assessment (Signed)
[facility] INPATIENT:  Family/Significant Other Suicide Prevention Education  Suicide Prevention Education:  Education Completed; Teresa Hunter, husband, [phone],  (name of family member/significant other) has been identified by the patient as the family member/significant other with whom the patient will be residing, and identified as the person(s) who will aid the patient in the event of a mental health crisis (suicidal ideations/suicide attempt).  With written consent from the patient, the family member/significant other has been provided the following suicide prevention education, prior to the and/or following the discharge of the patient.  The suicide prevention education provided includes the following:  Suicide risk factors  Suicide prevention and interventions  National Suicide Hotline telephone number  [facility] assessment telephone number  [facility] Assistance 911  [facility] and/or [facility]idential [facility] Crisis [facility] telephone number  Request made of family/significant other to:  Remove weapons (e.g., guns, rifles, knives), all items previously/currently identified as safety concern.    Remove drugs/medications (over-the-counter, prescriptions, illicit drugs), all items previously/currently identified as a safety concern.  The family member/significant other verbalizes understanding of the suicide prevention education information provided.  The family member/significant other agrees to remove the items of safety concern listed above.  Husband feels she is "doing good", "good with everything", feels "everything, the job, life" is what stresses her out. "She will be strong, she can go back to her job, her life."  Wants her to "stay there to help herself for a couple of days."  Will have patient stay w niece and sister "while she is getting strong", husband has changed jobs in order to be home more often for wife.  Has insurance in place as of 09/29/16.   Feels isolation "staying in the house is not good for her."  Job is often stressful.  No access to guns in the home; husband does not feel she would "ever harm herself, she never thinks about that."  Has "lots of support for her."  Reviewed SPE w husband, no questions/concerns expressed.     Sallee Langenne C Dustie Brittle 09/29/2016, 3:20 PM

## 2016-09-29 NOTE — Progress Notes (Signed)
Patient ID: Teresa NoonLavonda Dobrowolski, female   DOB: 09/24/1966, 51 y.o.   MRN: [id number]  DAR: Pt. Denies HI and A/V Hallucinations. She reports passive SI but is able to contract for safety. She reports she feels tired this morning. She presents with flat affect and depressed mood. Patient does report headache this morning and received Tylenol for this complaint with some relief. Support and encouragement provided to the patient. Patient is minimal but cooperative with this Clinical research associatewriter. Her husband came to visit today and patient reports this was a good visit. She wears a hijab as a part of her religious practice and culture. Her husband brought this in for her today. Q15 minute checks are maintained for safety.

## 2016-09-29 NOTE — [facility] Group Notes (Signed)
  [facility] LCSW Group Therapy  09/29/2016 1:28 PM  Type of Therapy:  Group Therapy  Participation Level:  Active  Participation Quality:  Appropriate  Affect:  Appropriate  Cognitive:  Appropriate  Insight:  Developing/Improving  Engagement in Therapy:  Engaged  Modes of Intervention:  Discussion, Exploration, Problem-solving and Rapport Building  Summary of Progress/Problems:  Patients discussed stresses and strengths related to discharge.  Identified concerns re discharge and processed feelings related to discharge.  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  Received affirmation and support for struggles w depression and her tendency to hide her need from family/health care providers, challenged to verbalize needs.    Sallee Langenne C Jezelle Gullick 09/29/2016, 1:28 PM

## 2016-09-29 NOTE — [facility] Suicide Risk Assessment (Signed)
[facility]   Nursing information obtained from:    Demographic factors:    Current Mental Status:    Loss Factors:    Historical Factors:    Risk Reduction Factors:     Total Time spent with patient: 30 minutes Principal Problem: MDD (major depressive disorder), recurrent severe, without psychosis (HCC) Diagnosis:   Patient Active Problem List   Diagnosis Date Noted  . Severe recurrent major depression without psychotic features (HCC) [F33.2] 09/28/2016  . MDD (major depressive disorder), recurrent severe, without psychosis (HCC) [F33.2] 09/28/2016  . Chronic cholecystitis [K81.1] 07/14/2012   Subjective Data: Patient presents with severe depressive sx , hopelessness, anhedonia, and Suicide attempt . Pt ok with zoloft - will start zoloft and continue to observe on the unit.  Continued Clinical Symptoms:  Alcohol Use Disorder Identification Test Final Score (AUDIT): 1 The "Alcohol Use Disorders Identification Test", Guidelines for Use in Primary Care, Second Edition.  World Science writerHealth Organization [facility](WHO). Score between 0-7:  no or low risk or alcohol related problems. Score between 8-15:  moderate risk of alcohol related problems. Score between 16-19:  high risk of alcohol related problems. Score 20 or above:  warrants further diagnostic evaluation for alcohol dependence and treatment.   CLINICAL FACTORS:   Depression:   Anhedonia Hopelessness Impulsivity Insomnia   Musculoskeletal: Strength & Muscle Tone: within normal limits Gait & Station: normal Patient leans: N/A  Psychiatric Specialty Exam: Physical Exam  Review of Systems  Psychiatric/Behavioral: Positive for depression and suicidal ideas. The patient is nervous/anxious and has insomnia.   All other systems reviewed and are negative.   Blood pressure (!) 131/91, pulse 96, temperature 98.3 F (36.8 C), temperature source Oral, resp. rate 16, height 5\' 8"  (1.727 m), weight 88.5 kg (195  lb).Body mass index is 29.65 kg/m.  General Appearance: Casual  Eye Contact:  Fair  Speech:  Clear and Coherent  Volume:  Normal  Mood:  Anxious and Depressed  Affect:  Congruent  Thought Process:  Goal Directed and Descriptions of Associations: Circumstantial  Orientation:  Full (Time, Place, and Person)  Thought Content:  Rumination  Suicidal Thoughts:  contracts for safety on the unit- S/P OD on pills  Homicidal Thoughts:  No  Memory:  Immediate;   Fair Recent;   Fair Remote;   Fair  Judgement:  Impaired  Insight:  Fair  Psychomotor Activity:  Normal  Concentration:  Concentration: Fair and Attention Span: Fair  Recall:  FiservFair  Fund of Knowledge:  Fair  Language:  Fair  Akathisia:  No  Handed:  Right  AIMS (if indicated):     Assets:  Communication Skills Desire for Improvement  ADL's:  Intact  Cognition:  WNL  Sleep:  Number of Hours: 4.25      COGNITIVE FEATURES THAT CONTRIBUTE TO RISK:  Closed-mindedness, Polarized thinking and Thought constriction (tunnel vision)    SUICIDE RISK:   Moderate:  Frequent suicidal ideation with limited intensity, and duration, some specificity in terms of plans, no associated intent, good self-control, limited dysphoria/symptomatology, some risk factors present, and identifiable protective factors, including available and accessible social support.   PLAN OF CARE: Please see H&P. Case discussed with NP . Reviewed [place] controlled substance database Last klonopin script 05/2016 - no refills - will not restart Klonopin.  I certify that inpatient services furnished can reasonably be expected to improve the patient's condition.  Venda Dice, MD 09/29/2016, 11:43 AM

## 2016-09-30 DIAGNOSIS — Z9049 Acquired absence of other specified parts of digestive tract: Secondary | ICD-10-CM

## 2016-09-30 LAB — URINALYSIS, COMPLETE (UACMP) WITH MICROSCOPIC
Bilirubin Urine: NEGATIVE
GLUCOSE, UA: NEGATIVE mg/dL
KETONES UR: NEGATIVE mg/dL
Nitrite: NEGATIVE
PROTEIN: 100 mg/dL — AB
SQUAMOUS EPITHELIAL / LPF: NONE SEEN
Specific Gravity, Urine: 1.023 (ref 1.005–1.030)
pH: 6 (ref 5.0–8.0)

## 2016-09-30 LAB — TSH: TSH: 2.907 u[IU]/mL (ref 0.350–4.500)

## 2016-09-30 NOTE — Progress Notes (Signed)
Pt has been in the dayroom most of the evening.  She attended evening group and had a visit with her husband during visitation.  She reports she is doing a little better, but still has these feelings of sadness and depression to the point of wishing she were not alive.  She says her frustration is that her husband and some of her family don't understand her depression.  She says they see "the outside" of her and can't see what is going on inside.  She says she needs a few more days in the hospital because of her depression.  She denies HI/AVH.  She contracts for safety.  Support and encouragement offered.  Discharge plans are in process.  Pt plans to return home at discharge or go stay with her sister for a while.  Safety maintained with q15 minute checks.

## 2016-09-30 NOTE — Progress Notes (Signed)
D: Pt presents with sad affect and depressed mood. Pt tearful on approach. Pt verbalized to writer that her family doesn't understand her mental illness. Pt stated that she may appear happy or well off but inside she's suffering. Pt rated depression 7/10. Anxiety 6/10. Pt endorses passive suicidal thoughts with no plan or intent. Pt verbally contracts for safety. Pt requested vistaril this morning for increased anxiety. Med administered to pt at that time. Pt stated that med was effective.  A: Medications reviewed with pt. Medications administered as ordered per MD. Shift assessment completed. Verbal support provided. 15 minute checks performed by staff.  R: Pt receptive to tx. Pt verbalized understanding of med regimen.

## 2016-09-30 NOTE — [facility] Group Notes (Signed)
[facility] LCSW Group Therapy 09/30/2016 1:15 PM  Type of Therapy: Group Therapy- Feelings about Diagnosis  Participation Level: Active   Participation Quality:  Appropriate  Affect:  Appropriate  Cognitive: Alert and Oriented   Insight:  Minimal  Engagement in Therapy: Developing/Improving and Engaged   Modes of Intervention: Clarification, Confrontation, Discussion, Education, Exploration, Limit-setting, Orientation, Problem-solving, Rapport Building, Dance movement psychotherapisteality Testing, Socialization and Support  Description of Group:   This group will allow patients to explore their thoughts and feelings about diagnoses they have received. Patients will be guided to explore their level of understanding and acceptance of these diagnoses. Facilitator will encourage patients to process their thoughts and feelings about the reactions of others to their diagnosis, and will guide patients in identifying ways to discuss their diagnosis with significant others in their lives. This group will be process-oriented, with patients participating in exploration of their own experiences as well as giving and receiving support and challenge from other group members.  Summary of Progress/Problems:  Pt identified feeling overwhelmed with responsibilities to her family. Pt and peers processed suggestions for setting boundaries with family that would allow her to care for herself more effectively. Pt was pleasant, but overall resistant to suggestions as peers "don't understand her family." Pt was unable to identify strategies to drawing boundaries and prioritizing responsibilities.   Therapeutic Modalities:   Cognitive Behavioral Therapy Solution Focused Therapy Motivational Interviewing Relapse Prevention Therapy  Vernie ShanksLauren Saturnino Liew, LCSW 09/30/2016 3:50 PM

## 2016-09-30 NOTE — Progress Notes (Signed)
Adult Psychoeducational Group Note  Date:  09/30/2016 Time:  0845  Group Topic/Focus:  Recovery Goals:   The focus of this group is to identify appropriate goals for recovery and establish a plan to achieve them.   Participation Level:  Active  Participation Quality:  Appropriate  Affect:  Appropriate  Cognitive:  Appropriate  Insight: Appropriate  Engagement in Group:  Improving  Modes of Intervention:  Discussion, Education and Orientation  Additional Comments:   Alyviah Crandle L 09/30/2016, 5:53 PM

## 2016-09-30 NOTE — Progress Notes (Signed)
Recreation Therapy Notes  Animal-Assisted Activity (AAA) Program Checklist/Progress Notes Patient Eligibility Criteria Checklist & Daily Group note for Rec TxIntervention  Date: 01.02.2018 Time: 2:45pm [address]    AAA/T Program Assumption of Risk Form signed by Patient/ or Parent Legal Guardian Yes  Patient is free of allergies or sever asthma Yes  Patient reports no fear of animals Yes  Patient reports no history of cruelty to animals Yes  Patient understands his/her participation is voluntary Yes  Patient washes hands before animal contact Yes  Patient washes hands after animal contact Yes  Behavioral Response: Appropriate   Education:Hand Washing, Appropriate Animal Interaction   Education Outcome: Acknowledges education.   Clinical Observations/Feedback: Patient attended session and interacted appropriately with therapy dog and peers.    Teresa Hunter, LRT/CTRS        Julieana Eshleman L 09/30/2016 3:10 PM

## 2016-09-30 NOTE — Tx Team (Signed)
Interdisciplinary Treatment and Diagnostic Plan Update  09/30/2016 Time of Session: 4:25 PM  Teresa Hunter MRN: [id number]  Principal Diagnosis: MDD (major depressive disorder), recurrent severe, without psychosis ([place])  Secondary Diagnoses: Principal Problem:   MDD (major depressive disorder), recurrent severe, without psychosis ([place])   Current Medications:  Current Facility-Administered Medications  Medication Dose Route Frequency Provider Last Rate Last Dose  . acetaminophen (TYLENOL) tablet 650 mg  650 mg Oral Q6H PRN Patrecia Pour, NP   650 mg at 09/29/16 0905  . albuterol (PROVENTIL HFA;VENTOLIN HFA) 108 (90 Base) MCG/ACT inhaler 1-2 puff  1-2 puff Inhalation Q6H PRN Patrecia Pour, NP      . albuterol (PROVENTIL) (2.5 MG/3ML) 0.083% nebulizer solution 3 mL  3 mL Inhalation Q6H PRN Patrecia Pour, NP      . alum & mag hydroxide-simeth (MAALOX/MYLANTA) 200-200-20 MG/5ML suspension 30 mL  30 mL Oral Q4H PRN Patrecia Pour, NP      . busPIRone (BUSPAR) tablet 7.5 mg  7.5 mg Oral BID Benjamine Mola, FNP   7.5 mg at 09/30/16 2924  . gabapentin (NEURONTIN) capsule 300 mg  300 mg Oral QHS Patrecia Pour, NP   300 mg at 09/29/16 2142  . hydrOXYzine (ATARAX/VISTARIL) tablet 50 mg  50 mg Oral BID PRN Ursula Alert, MD   50 mg at 09/30/16 0810  . magnesium hydroxide (MILK OF MAGNESIA) suspension 30 mL  30 mL Oral Daily PRN Patrecia Pour, NP      . sertraline (ZOLOFT) tablet 50 mg  50 mg Oral Daily Patrecia Pour, NP   50 mg at 09/30/16 4628  . traZODone (DESYREL) tablet 50 mg  50 mg Oral QHS,MR X 1 Rozetta Nunnery, NP   50 mg at 09/29/16 2142    PTA Medications: Prescriptions Prior to Admission  Medication Sig Dispense Refill Last Dose  . albuterol (PROVENTIL HFA;VENTOLIN HFA) 108 (90 BASE) MCG/ACT inhaler Inhale 1-2 puffs into the lungs every 6 (six) hours as needed for wheezing or shortness of breath. (Patient not taking: Reported on 09/28/2016) 1 Inhaler 0 Not Taking at Unknown time  .  clonazePAM (KLONOPIN) 0.5 MG tablet Take 0.25 mg by mouth daily as needed for anxiety.   Past Week at Unknown time  . dicyclomine (BENTYL) 20 MG tablet Take 20 mg by mouth daily.   Past Month at Unknown time  . enoxaparin (LOVENOX) 150 MG/ML injection 0.9 mL (=155m = 1 mg/kg) [place] q12 hrs until directed by physician AND INR therapeutic (Patient not taking: Reported on 09/28/2016) 14 mL 1 Not Taking at Unknown time  . ibuprofen (ADVIL,MOTRIN) 200 MG tablet Take 400 mg by mouth every 8 (eight) hours as needed for headache.   Past Week at Unknown time  . meloxicam (MOBIC) 7.5 MG tablet Take 1 tablet (7.5 mg total) by mouth daily. May take 2 if needed (Patient not taking: Reported on 09/28/2016) 60 tablet 1 Not Taking at Unknown time  . Multiple Vitamins-Minerals (THERA-M) TABS Take 1 tablet by mouth daily.   Past Month at Unknown time  . phentermine 37.5 MG capsule Take 37.5 mg by mouth every morning.   Not Taking at Unknown time  . warfarin (COUMADIN) 5 MG tablet Take 1 tablet (5 mg total) by mouth daily. (Patient not taking: Reported on 09/28/2016) 30 tablet 0 Not Taking at Unknown time    Treatment Modalities: Medication Management, Group therapy, Case management,  1 to 1 session with clinician, Psychoeducation, Recreational therapy.  Patient Stressors: Marital or family conflict Medication change or noncompliance  Patient Strengths: Ability for insight Average or above average intelligence Capable of independent living Curator fund of knowledge Motivation for treatment/growth  Physician Treatment Plan for Primary Diagnosis: MDD (major depressive disorder), recurrent severe, without psychosis ([place]) Long Term Goal(s): Improvement in symptoms so as ready for discharge  Short Term Goals: Ability to identify changes in lifestyle to reduce recurrence of condition will improve Ability to verbalize feelings will improve Ability to disclose and discuss suicidal ideas Ability  to demonstrate self-control will improve Ability to identify and develop effective coping behaviors will improve Ability to maintain clinical measurements within normal limits will improve Compliance with prescribed medications will improve Ability to identify changes in lifestyle to reduce recurrence of condition will improve Ability to verbalize feelings will improve Ability to disclose and discuss suicidal ideas Ability to demonstrate self-control will improve Ability to identify and develop effective coping behaviors will improve Ability to maintain clinical measurements within normal limits will improve Compliance with prescribed medications will improve  Medication Management: Evaluate patient's response, side effects, and tolerance of medication regimen.  Therapeutic Interventions: 1 to 1 sessions, Unit Group sessions and Medication administration.  Evaluation of Outcomes: Not Met  Physician Treatment Plan for Secondary Diagnosis: Principal Problem:   MDD (major depressive disorder), recurrent severe, without psychosis ([place])   Long Term Goal(s): Improvement in symptoms so as ready for discharge  Short Term Goals: Ability to identify changes in lifestyle to reduce recurrence of condition will improve Ability to verbalize feelings will improve Ability to disclose and discuss suicidal ideas Ability to demonstrate self-control will improve Ability to identify and develop effective coping behaviors will improve Ability to maintain clinical measurements within normal limits will improve Compliance with prescribed medications will improve Ability to identify changes in lifestyle to reduce recurrence of condition will improve Ability to verbalize feelings will improve Ability to disclose and discuss suicidal ideas Ability to demonstrate self-control will improve Ability to identify and develop effective coping behaviors will improve Ability to maintain clinical measurements within  normal limits will improve Compliance with prescribed medications will improve  Medication Management: Evaluate patient's response, side effects, and tolerance of medication regimen.  Therapeutic Interventions: 1 to 1 sessions, Unit Group sessions and Medication administration.  Evaluation of Outcomes: Not Met   RN Treatment Plan for Primary Diagnosis: MDD (major depressive disorder), recurrent severe, without psychosis ([place]) Long Term Goal(s): Knowledge of disease and therapeutic regimen to maintain health will improve  Short Term Goals: Ability to verbalize feelings will improve, Ability to disclose and discuss suicidal ideas and Ability to identify and develop effective coping behaviors will improve  Medication Management: RN will administer medications as ordered by provider, will assess and evaluate patient's response and provide education to patient for prescribed medication. RN will report any adverse and/or side effects to prescribing provider.  Therapeutic Interventions: 1 on 1 counseling sessions, Psychoeducation, Medication administration, Evaluate responses to treatment, Monitor vital signs and CBGs as ordered, Perform/monitor CIWA, COWS, AIMS and Fall Risk screenings as ordered, Perform wound care treatments as ordered.  Evaluation of Outcomes: Not Met   LCSW Treatment Plan for Primary Diagnosis: MDD (major depressive disorder), recurrent severe, without psychosis ([place]) Long Term Goal(s): Safe transition to appropriate next level of care at discharge, Engage patient in therapeutic group addressing interpersonal concerns.  Short Term Goals: Engage patient in aftercare planning with referrals and resources, Identify triggers associated with mental health/substance abuse issues and Increase  skills for wellness and recovery  Therapeutic Interventions: Assess for all discharge needs, 1 to 1 time with Social worker, Explore available resources and support systems, Assess for  adequacy in community support network, Educate family and significant other(s) on suicide prevention, Complete Psychosocial Assessment, Interpersonal group therapy.  Evaluation of Outcomes: Not Met   Progress in Treatment: Attending groups: Yes  Participating in groups: Yes Taking medication as prescribed: Yes, MD continues to assess for medication changes as needed Toleration medication: Yes, no side effects reported at this time Family/Significant other contact made: Yes with husband Patient understands diagnosis: Continuing to assess Discussing patient identified problems/goals with staff: Yes Medical problems stabilized or resolved: Yes Denies suicidal/homicidal ideation: No, endorses passive SI Issues/concerns per patient self-inventory: None Other: N/A  New problem(s) identified: None identified at this time.   New Short Term/Long Term Goal(s): None identified at this time.   Discharge Plan or Barriers: Pt will return home and follow-up with outpatient services.   Reason for Continuation of Hospitalization: Anxiety Depression Medication stabilization Suicidal ideation  Estimated Length of Stay: 3-5 days  Attendees: Patient: 09/30/2016  4:25 PM  Physician: Dr. Shea Evans, MD 09/30/2016  4:25 PM  Nursing: Darrol Angel, RN 09/30/2016  4:25 PM  RN Care Manager: Lars Pinks, RN 09/30/2016  4:25 PM  Social Worker: Adriana Reams, LCSW; [place], LCSW 09/30/2016  4:25 PM  Recreational Therapist:  09/30/2016  4:25 PM  Other: Catalina Pizza, NP; Samuel Jester, NP 09/30/2016  4:25 PM  Other:  09/30/2016  4:25 PM  Other: 09/30/2016  4:25 PM    Scribe for Treatment Team: Gladstone Lighter, LCSW 09/30/2016 4:25 PM

## 2016-09-30 NOTE — Progress Notes (Signed)
[facility] MD Progress Note  09/30/2016 4:20 PM Teresa Hunter  MRN:  [id number] Subjective:  "I feel a lot better today but still depressed."  Objective: Pt seen and chart reviewed. Pt is alert/oriented x4, calm, cooperative, and appropriate to situation. Pt denies suicidal/homicidal ideation and psychosis and does not appear to be responding to internal stimuli. Pt reports that she is not suicidal today and is able to contract for safety but is still very anxious. She does report some improvement.  Principal Problem: MDD (major depressive disorder), recurrent severe, without psychosis (Teresa Hunter) Diagnosis:   Patient Active Problem List   Diagnosis Date Noted  . MDD (major depressive disorder), recurrent severe, without psychosis (Teresa Hunter) [F33.2] 09/28/2016    Priority: High  . Severe recurrent major depression without psychotic features (Teresa Hunter) [F33.2] 09/28/2016  . Chronic cholecystitis [K81.1] 07/14/2012   Total Time spent with patient: 15 minutes  Past Psychiatric History: see H&P  Past Medical History:  Past Medical History:  Diagnosis Date  . Allergy   . Anxiety   . Asthma   . Family history of anesthesia complication    pt's mother slow to wake up   . GERD (gastroesophageal reflux disease)   . Hypertension 2005   stress related /weight fluctuates.   . Insomnia   . Migraine headache     Past Surgical History:  Procedure Laterality Date  . CHOLECYSTECTOMY  08/06/2012   Procedure: LAPAROSCOPIC CHOLECYSTECTOMY;  Surgeon: Harl Bowie, MD;  Location: [place];  Service: General;  Laterality: N/A;  . COLONOSCOPY  2013 october- Dr Collene Mares   removal of polyps with endoscopy /colonoscopy  . EYE SURGERY     laser  . laser     B/L glaucoma  . TONSILLECTOMY     Family History:  Family History  Problem Relation Age of Onset  . Cancer Mother     multiple myeloma  . Hypertension Sister   . Cancer Maternal Grandmother   . Cancer Maternal Grandfather   . Hypertension Paternal Grandmother    . Cancer Paternal Grandfather    Family Psychiatric  History: see H&P Social History:  History  Alcohol Use No     History  Drug Use No    Social History   Social History  . Marital status: Married    Spouse name: N/A  . Number of children: N/A  . Years of education: N/A   Social History Main Topics  . Smoking status: Never Smoker  . Smokeless tobacco: Never Used  . Alcohol use No  . Drug use: No  . Sexual activity: Yes   Other Topics Concern  . None   Social History Narrative  . None   Additional Social History:                         Sleep: Fair  Appetite:  Fair  Current Medications: Current Facility-Administered Medications  Medication Dose Route Frequency Provider Last Rate Last Dose  . acetaminophen (TYLENOL) tablet 650 mg  650 mg Oral Q6H PRN Patrecia Pour, NP   650 mg at 09/29/16 0905  . albuterol (PROVENTIL HFA;VENTOLIN HFA) 108 (90 Base) MCG/ACT inhaler 1-2 puff  1-2 puff Inhalation Q6H PRN Patrecia Pour, NP      . albuterol (PROVENTIL) (2.5 MG/3ML) 0.083% nebulizer solution 3 mL  3 mL Inhalation Q6H PRN Patrecia Pour, NP      . alum & mag hydroxide-simeth (MAALOX/MYLANTA) 200-200-20 MG/5ML suspension 30 mL  30  mL Oral Q4H PRN Patrecia Pour, NP      . busPIRone (BUSPAR) tablet 7.5 mg  7.5 mg Oral BID Benjamine Mola, FNP   7.5 mg at 09/30/16 2500  . gabapentin (NEURONTIN) capsule 300 mg  300 mg Oral QHS Patrecia Pour, NP   300 mg at 09/29/16 2142  . hydrOXYzine (ATARAX/VISTARIL) tablet 50 mg  50 mg Oral BID PRN Ursula Alert, MD   50 mg at 09/30/16 0810  . magnesium hydroxide (MILK OF MAGNESIA) suspension 30 mL  30 mL Oral Daily PRN Patrecia Pour, NP      . sertraline (ZOLOFT) tablet 50 mg  50 mg Oral Daily Patrecia Pour, NP   50 mg at 09/30/16 3704  . traZODone (DESYREL) tablet 50 mg  50 mg Oral QHS,MR X 1 Rozetta Nunnery, NP   50 mg at 09/29/16 2142    Lab Results:  Results for orders placed or performed during the hospital encounter  of 09/28/16 (from the past 48 hour(s))  TSH     Status: None   Collection Time: 09/30/16  6:19 AM  Result Value Ref Range   TSH 2.907 0.350 - 4.500 uIU/mL    Comment: Performed by a 3rd Generation assay with a functional sensitivity of <=0.01 uIU/mL. Performed at [facility]     Blood Alcohol level:  Lab Results  Component Value Date   [facility] <5 88/89/1694    Metabolic Disorder Labs: No results found for: HGBA1C, MPG No results found for: PROLACTIN No results found for: CHOL, TRIG, HDL, CHOLHDL, VLDL, LDLCALC  Physical Findings: AIMS: Facial and Oral Movements Muscles of Facial Expression: None, normal Lips and Perioral Area: None, normal Jaw: None, normal Tongue: None, normal,Extremity Movements Upper (arms, wrists, hands, fingers): None, normal Lower (legs, knees, ankles, toes): None, normal, Trunk Movements Neck, shoulders, hips: None, normal, Overall Severity Severity of abnormal movements (highest score from questions above): None, normal Incapacitation due to abnormal movements: None, normal Patient's awareness of abnormal movements (rate only patient's report): No Awareness, Dental Status Current problems with teeth and/or dentures?: No Does patient usually wear dentures?: No  CIWA:    COWS:     Musculoskeletal: Strength & Muscle Tone: within normal limits Gait & Station: normal Patient leans: N/A  Psychiatric Specialty Exam: Physical Exam  Review of Systems  Psychiatric/Behavioral: Positive for depression and suicidal ideas. Negative for hallucinations and substance abuse. The patient is nervous/anxious and has insomnia.   All other systems reviewed and are negative.   Blood pressure 123/71, pulse 86, temperature 98.1 F (36.7 C), temperature source Oral, resp. rate 18, height _0  (1.727 m), weight 88.5 kg (195 lb).Body mass index is 29.65 kg/m.  General Appearance: Casual and Fairly Groomed  Eye Contact:  Good  Speech:  Clear and  Coherent and Normal Rate  Volume:  Normal  Mood:  Anxious and Depressed  Affect:  Appropriate, Congruent and Depressed  Thought Process:  Coherent, Goal Directed, Linear and Descriptions of Associations: Intact  Orientation:  Full (Time, Place, and Person)  Thought Content:  Focused on depression, anxiety  Suicidal Thoughts:  minimizing today  Homicidal Thoughts:  No  Memory:  Immediate;   Fair Recent;   Fair Remote;   Fair  Judgement:  Fair  Insight:  Fair  Psychomotor Activity:  Normal  Concentration:  Concentration: Fair and Attention Span: Fair  Recall:  [employer] of Knowledge:  Fair  Language:  Fair  Akathisia:  No  Handed:    AIMS (if indicated):     Assets:  Communication Skills Desire for Improvement Resilience Social Support  ADL's:  Intact  Cognition:  WNL  Sleep:  Number of Hours: 6.75   Treatment Plan Summary: MDD (major depressive disorder), recurrent severe, without psychosis ([place]) unstable, with overdose, managed as below:  Medications:  -Continue buspar 7.26m po bid for anxiety -Continue neurontin 3060mpo qhs for neuropathic pain/insomnia -Trazodone 5047mo qhs rpt x1 prn insomnia -Continue zoloft 14m38m daily for depression  WithBenjamine MolaP 09/30/2016, 4:20 PM

## 2016-09-30 NOTE — Progress Notes (Signed)
Pt c/o pressure in abd during urination, urgency and blood in urine. Writer made Velna HatchetSheila May, NP aware. Per NP., collect urine sample for UA. UA ordered and collected by Clinical research associatewriter.

## 2016-10-01 MED ORDER — GABAPENTIN 300 MG PO CAPS
300.0000 mg | ORAL_CAPSULE | Freq: Two times a day (BID) | ORAL | Status: DC
  Administered 2016-10-01 – 2016-10-02 (×2): 300 mg via ORAL
  Filled 2016-10-01 (×7): qty 1

## 2016-10-01 MED ORDER — SERTRALINE HCL 25 MG PO TABS
75.0000 mg | ORAL_TABLET | Freq: Every day | ORAL | Status: DC
  Administered 2016-10-02: 75 mg via ORAL
  Filled 2016-10-01 (×4): qty 1

## 2016-10-01 MED ORDER — CEPHALEXIN 500 MG PO CAPS
500.0000 mg | ORAL_CAPSULE | Freq: Two times a day (BID) | ORAL | Status: DC
  Administered 2016-10-01 – 2016-10-07 (×12): 500 mg via ORAL
  Filled 2016-10-01 (×4): qty 1
  Filled 2016-10-01: qty 2
  Filled 2016-10-01 (×3): qty 1
  Filled 2016-10-01: qty 2
  Filled 2016-10-01 (×4): qty 1
  Filled 2016-10-01: qty 2
  Filled 2016-10-01 (×2): qty 1
  Filled 2016-10-01 (×2): qty 2
  Filled 2016-10-01: qty 1

## 2016-10-01 MED ORDER — TRAZODONE HCL 50 MG PO TABS
50.0000 mg | ORAL_TABLET | Freq: Every evening | ORAL | Status: DC | PRN
  Administered 2016-10-01 – 2016-10-06 (×6): 50 mg via ORAL
  Filled 2016-10-01 (×5): qty 1

## 2016-10-01 NOTE — Progress Notes (Signed)
D: Pt presents sad with depressed mood. Pt rates depression 6/10. Anxiety 6/10. Pt endorses passive suicidal thoughts with no intent or plan today. Pt verbally contracts for safety. Pt continues to report ongoing issues with her husband and family that needs to be addressed. Pt requesting family session to help educate family on mental illness. Pt compliant with taking meds and attending groups.  A: Medications reviewed with pt. Medications administered as ordered per MD. Verbal support provided. Pt encouraged to attend groups. 15 minute checks performed for safety. R: Pt receptive to tx. Pt verbalized understanding of med regimen.

## 2016-10-01 NOTE — [facility] Group Notes (Signed)
[facility] LCSW Group Therapy 10/01/2016 1:15 PM  Type of Therapy: Group Therapy- Emotion Regulation  Participation Level: Active   Participation Quality:  Appropriate  Affect: Appropriate  Cognitive: Alert and Oriented   Insight:  Developing/Improving  Engagement in Therapy: Developing/Improving and Engaged   Modes of Intervention: Clarification, Confrontation, Discussion, Education, Exploration, Limit-setting, Orientation, Problem-solving, Rapport Building, Dance movement psychotherapisteality Testing, Socialization and Support  Summary of Progress/Problems: The topic for group today was emotional regulation. This group focused on both positive and negative emotion identification and allowed group members to process ways to identify feelings, regulate negative emotions, and find healthy ways to manage internal/external emotions. Group members were asked to reflect on a time when their reaction to an emotion led to a negative outcome and explored how alternative responses using emotion regulation would have benefited them. Group members were also asked to discuss a time when emotion regulation was utilized when a negative emotion was experienced. Pt identified sadness and anxiety as the two emotions she finds the most difficult to regulate. Pt expresses that she has given up exciting job opportunities because of her fear that her mental illness will not allow her to do her job well.    Teresa ShanksLauren Regis Wiland, LCSW 10/01/2016 3:59 PM

## 2016-10-01 NOTE — Progress Notes (Signed)
Recreation Therapy Notes  Date: 10/01/16 Time: 0930 Location: [address] Dayroom  Group Topic: Stress Management  Goal Area(s) Addresses:  Patient will verbalize importance of using healthy stress management.  Patient will identify positive emotions associated with healthy stress management.   Intervention: Stress Management  Activity :  Letting Go Meditation.  LRT introduced to stress management technique of meditation.  LRT played a meditation focused on letting go of the past and focusing on the now from the Calm app.  Patients were to follow along as the meditation played to fully engage in the technique.  Education:  Stress Management, Discharge Planning.   Education Outcome: Acknowledges edcuation/In group clarification offered/Needs additional education  Clinical Observations/Feedback: Pt did not attend group.    Caroll RancherMarjette Emrys Mck[place]ie, LRT/CTRS         Caroll RancherLindsay, Brya Simerly A 10/01/2016 11:23 AM

## 2016-10-01 NOTE — Progress Notes (Addendum)
[facility] MD Progress Note  10/01/2016 6:21 PM Danaria Larsen  MRN:  [id number] Subjective:  Patient reports some partial improvement compared to admission, but states it has been modest, and continues to feel depressed, sad. Denies active suicidal ideations, but describes passive thoughts of death, dying. Ruminates about stressors, and states she feels her family /husband do not understand depression and unrealistically expect her to essentially " snap out of it".  Denies medication side effects.  Objective:  I have discussed case with treatment team and have met with patient . Patient reports chronic depression, recently worsening . At this time reports ongoing sense of sadness, depression, low energy level, some anhedonia, passive thoughts of dying , but denies plan or intention of hurting self or of suicide and contracts for safety on unit . As above, ruminates about family members not understanding depression as an illness that she cannot simply get better from at will .  She has been going to groups and is visible on unit, no disruptive or agitated behaviors . Denies medication side effects- currently on Zoloft , Buspar  ( new medication trials )    Principal Problem: MDD (major depressive disorder), recurrent severe, without psychosis ([place]) Diagnosis:   Patient Active Problem List   Diagnosis Date Noted  . Severe recurrent major depression without psychotic features ([place]) [F33.2] 09/28/2016  . MDD (major depressive disorder), recurrent severe, without psychosis ([place]) [F33.2] 09/28/2016  . Chronic cholecystitis [K81.1] 07/14/2012   Total Time spent with patient: 20 minutes   Past Psychiatric History: see H&P  Past Medical History:  Past Medical History:  Diagnosis Date  . Allergy   . Anxiety   . Asthma   . Family history of anesthesia complication    pt's mother slow to wake up   . GERD (gastroesophageal reflux disease)   . Hypertension 2005   stress related /weight fluctuates.    . Insomnia   . Migraine headache     Past Surgical History:  Procedure Laterality Date  . CHOLECYSTECTOMY  08/06/2012   Procedure: LAPAROSCOPIC CHOLECYSTECTOMY;  Surgeon: Harl Bowie, MD;  Location: [place];  Service: General;  Laterality: N/A;  . COLONOSCOPY  2013 october- Dr Collene Mares   removal of polyps with endoscopy /colonoscopy  . EYE SURGERY     laser  . laser     B/L glaucoma  . TONSILLECTOMY     Family History:  Family History  Problem Relation Age of Onset  . Cancer Mother     multiple myeloma  . Hypertension Sister   . Cancer Maternal Grandmother   . Cancer Maternal Grandfather   . Hypertension Paternal Grandmother   . Cancer Paternal Grandfather    Family Psychiatric  History: see H&P Social History:  History  Alcohol Use No     History  Drug Use No    Social History   Social History  . Marital status: Married    Spouse name: N/A  . Number of children: N/A  . Years of education: N/A   Social History Main Topics  . Smoking status: Never Smoker  . Smokeless tobacco: Never Used  . Alcohol use No  . Drug use: No  . Sexual activity: Yes   Other Topics Concern  . None   Social History Narrative  . None   Additional Social History:   Sleep: improved   Appetite:  Improved   Current Medications: Current Facility-Administered Medications  Medication Dose Route Frequency Provider Last Rate Last Dose  . acetaminophen (  TYLENOL) tablet 650 mg  650 mg Oral Q6H PRN Patrecia Pour, NP   650 mg at 09/29/16 0905  . albuterol (PROVENTIL HFA;VENTOLIN HFA) 108 (90 Base) MCG/ACT inhaler 1-2 puff  1-2 puff Inhalation Q6H PRN Patrecia Pour, NP      . albuterol (PROVENTIL) (2.5 MG/3ML) 0.083% nebulizer solution 3 mL  3 mL Inhalation Q6H PRN Patrecia Pour, NP      . alum & mag hydroxide-simeth (MAALOX/MYLANTA) 200-200-20 MG/5ML suspension 30 mL  30 mL Oral Q4H PRN Patrecia Pour, NP      . busPIRone (BUSPAR) tablet 7.5 mg  7.5 mg Oral BID Benjamine Mola, FNP    7.5 mg at 10/01/16 1709  . gabapentin (NEURONTIN) capsule 300 mg  300 mg Oral BID Jenne Campus, MD   300 mg at 10/01/16 1709  . hydrOXYzine (ATARAX/VISTARIL) tablet 50 mg  50 mg Oral BID PRN Ursula Alert, MD   50 mg at 10/01/16 0817  . magnesium hydroxide (MILK OF MAGNESIA) suspension 30 mL  30 mL Oral Daily PRN Patrecia Pour, NP      . Derrill Memo ON 10/02/2016] sertraline (ZOLOFT) tablet 75 mg  75 mg Oral Daily Jenne Campus, MD      . traZODone (DESYREL) tablet 50 mg  50 mg Oral QHS PRN Jenne Campus, MD        Lab Results:  Results for orders placed or performed during the hospital encounter of 09/28/16 (from the past 48 hour(s))  TSH     Status: None   Collection Time: 09/30/16  6:19 AM  Result Value Ref Range   TSH 2.907 0.350 - 4.500 uIU/mL    Comment: Performed by a 3rd Generation assay with a functional sensitivity of <=0.01 uIU/mL. Performed at [facility]   Urinalysis, Complete w Microscopic     Status: Abnormal   Collection Time: 09/30/16  3:29 PM  Result Value Ref Range   Color, Urine YELLOW YELLOW   APPearance CLOUDY (A) CLEAR   Specific Gravity, Urine 1.023 1.005 - 1.030   pH 6.0 5.0 - 8.0   Glucose, UA NEGATIVE NEGATIVE mg/dL   Hgb urine dipstick LARGE (A) NEGATIVE   Bilirubin Urine NEGATIVE NEGATIVE   Ketones, ur NEGATIVE NEGATIVE mg/dL   Protein, ur 100 (A) NEGATIVE mg/dL   Nitrite NEGATIVE NEGATIVE   Leukocytes, UA MODERATE (A) NEGATIVE   RBC / HPF TOO NUMEROUS TO COUNT 0 - 5 RBC/hpf   WBC, UA TOO NUMEROUS TO COUNT 0 - 5 WBC/hpf   Bacteria, UA FEW (A) NONE SEEN   Squamous Epithelial / LPF NONE SEEN NONE SEEN   Mucous PRESENT    Non Squamous Epithelial 0-5 (A) NONE SEEN    Comment: Performed at [facility]    Blood Alcohol level:  Lab Results  Component Value Date   [facility] <5 11/91/4782    Metabolic Disorder Labs: No results found for: HGBA1C, MPG No results found for: PROLACTIN No results found for: CHOL,  TRIG, HDL, CHOLHDL, VLDL, LDLCALC  Physical Findings: AIMS: Facial and Oral Movements Muscles of Facial Expression: None, normal Lips and Perioral Area: None, normal Jaw: None, normal Tongue: None, normal,Extremity Movements Upper (arms, wrists, hands, fingers): None, normal Lower (legs, knees, ankles, toes): None, normal, Trunk Movements Neck, shoulders, hips: None, normal, Overall Severity Severity of abnormal movements (highest score from questions above): None, normal Incapacitation due to abnormal movements: None, normal Patient's awareness of abnormal movements (rate  only patient's report): No Awareness, Dental Status Current problems with teeth and/or dentures?: No Does patient usually wear dentures?: No  CIWA:    COWS:     Musculoskeletal: Strength & Muscle Tone: within normal limits Gait & Station: normal Patient leans: N/A  Psychiatric Specialty Exam: Physical Exam  Review of Systems  Psychiatric/Behavioral: Positive for depression and suicidal ideas. Negative for hallucinations and substance abuse. The patient is nervous/anxious and has insomnia.   All other systems reviewed and are negative.   Blood pressure 101/72, pulse 84, temperature 98.6 F (37 C), temperature source Oral, resp. rate 18, height _0  (1.727 m), weight 88.5 kg (195 lb).Body mass index is 29.65 kg/m.  General Appearance: Well Groomed  Eye Contact:  Good  Speech:  Normal Rate  Volume:  Normal  Mood:  Depressed  Affect:  Constricted, but does smile briefly at times   Thought Process:  Linear and Descriptions of Associations: Intact  Orientation:  Full (Time, Place, and Person)  Thought Content:  Denies hallucinations, no delusions   Suicidal Thoughts:  Denies suicidal plan or intention, contracts for safety on unit, endorses some passive SI   Homicidal Thoughts:  No- denies homicidal or violent ideations  Memory:  Recent and remote grossly intact   Judgement:  Improving   Insight:  Improving    Psychomotor Activity:  Normal  Concentration:  Concentration: Good and Attention Span: Good  Recall:  Good  Fund of Knowledge:  Good  Language:  Good  Akathisia:  No  Handed:    AIMS (if indicated):     Assets:  Communication Skills Desire for Improvement Resilience Social Support  ADL's:  Intact  Cognition:  WNL  Sleep:  Number of Hours: 6.75    Assessment - patient reports ongoing depression, sadness, and tends to ruminate about family not understanding her depression or being supportive. She is endorsing some passive thoughts of death, but denies any plan or intention of suicide at this time and contracts for safety. Thus far tolerating Zoloft trial well. Of note, patient's UA is suggestive of UTI- denies dysuria, urgency, but describes mild sense of suprapubic pressure. No fever or chills . States she was recently on antibiotic ( not sure of which one) for upper respiratory symptoms.  Treatment Plan Summary:   Medications:  -Encourage group and milieu participation to work on coping skills and symptom reduction -Continue Buspar 7.79m po bid for anxiety -Increase  Neurontin  To 3049mpo bid  for pain and anxiety  -Continue Trazodone 5079mo qhs prn for insomnia -Increase  Zoloft to 75 mg po daily for depression -Treatment team working on disposition planning  -Discussed with  hospitalist consultant regarding UA findings- recommend to start Keflex course- 500 mgrs BID   COBNeita GarnetD 10/01/2016, 6:21 PM   Patient ID: LavQuentin Angstemale   DOB: [birth year] 67o.   MRN: [id number]

## 2016-10-01 NOTE — Progress Notes (Signed)
Pt reports that she is some better, but still c/o depression and difficulty dealing situations in her life.  She said today that someone spoke to her about her past and it made her realize that there are things in her past that she has not dealt with.  She says she is glad to be here and wants to work on her issues so that she can function "normally" when she gets home.  She says she does not want to discharge too early and have to return.  Pt denies SI/HI/AVH at this time.  Pt makes her needs known and voices no needs or concerns at this time.  Support and encouragement offered.  Discharge plans are in process.  Safety maintained with q15 minute checks.

## 2016-10-01 NOTE — Progress Notes (Addendum)
Adult Psychoeducational Group Note  Date:  10/01/2016 Time:  10:39 PM  Group Topic/Focus:  Wrap-Up Group:   The focus of this group is to help patients review their daily goal of treatment and discuss progress on daily workbooks.   Participation Level:  Active  Participation Quality:  Appropriate  Affect:  Appropriate  Cognitive:  Appropriate  Insight: Appropriate  Engagement in Group:  Engaged  Modes of Intervention:  Discussion  Additional Comments:  Patient attended wrap-up group and said that her day started as a 2 but later changed to 10.  Her day improved after she took her medications and had conversation with the other patients.  Seleen Walter W Karlin Binion 10/01/2016, 10:39 PM

## 2016-10-02 MED ORDER — ARIPIPRAZOLE 2 MG PO TABS
2.0000 mg | ORAL_TABLET | Freq: Every day | ORAL | Status: DC
  Administered 2016-10-02 – 2016-10-03 (×2): 2 mg via ORAL
  Filled 2016-10-02 (×5): qty 1

## 2016-10-02 MED ORDER — SERTRALINE HCL 100 MG PO TABS
100.0000 mg | ORAL_TABLET | Freq: Every day | ORAL | Status: DC
  Administered 2016-10-03 – 2016-10-07 (×5): 100 mg via ORAL
  Filled 2016-10-02 (×7): qty 1

## 2016-10-02 NOTE — Plan of Care (Signed)
Problem: Activity: Goal: Interest or engagement in activities will improve Outcome: Progressing Patient attended karaoke this evening and stated she "danced to a [employer] song".

## 2016-10-02 NOTE — Progress Notes (Signed)
Nursing Progress Note 7p-7a  D) Patient presents pleasant and cooperative but appears sad and depressed. Patient reports having a good day and a good visit with her husband. Patient reports passive SI and "bad thoughts" all the time. Patient contracts for safety. Patient denies HI/AVH or pain. Patient reported anxiety 5/10 and requested medication.  A) PRN anxiety medication given. Emotional support given. Patient medicated with PM orders as prescribed. Medications reviewed with patient. Patient on q15 min safety checks. Opportunities for questions or concerns presented to patient. Patient encouraged to continue to work on treatment goals.  R) Patient reported PRN medication was effective. Patient receptive to interaction with nurse. Patient remains safe on the unit at this time. Patient is resting in bed without complaints. Will continue to monitor.

## 2016-10-02 NOTE — [facility] Group Notes (Signed)
[facility] Group Notes:  (Nursing/MHT/Case Management/Adjunct)  Date:  10/02/2016  Time:  11:01 AM  Type of Therapy:  Nurse Education  Participation Level:  Did Not Attend  Participation Quality:  did not attend  Affect:  did not attend  Cognitive:  did not attend  Insight:  None  Engagement in Group:  did not attend  Modes of Intervention:  Discussion and Education  Summary of Progress/Problems:  Teresa LunaBeck, Teresa Hunter 10/02/2016, 11:01 AM

## 2016-10-02 NOTE — Progress Notes (Signed)
Nursing Progress Note 7p-7a  D) Patient presents with flat affect but is pleasant and cooperative with Clinical research associatewriter. Patient reports having a good day and reports no further symptoms this evening. Patient reports passive SI but no HI/AVH or pain. Patient contracts for safety at this time. Patient [place] but "got up to dance to a [employer] song".   A) Emotional support given. Patient medicated with PM orders as prescribed for sleep. Medications reviewed with patient. Patient on q15 min safety checks. Opportunities for questions or concerns presented to patient. Patient encouraged to continue to work on treatment goals.  R) Patient receptive to interaction with nurse. Patient remains safe on the unit at this time. Patient is resting in bed without complaints. Will continue to monitor.

## 2016-10-02 NOTE — [facility] Group Notes (Signed)
[facility] Group Therapy 10/02/2016 1:15pm  Type of Therapy: Mental Health Association Presentation  Participation Level: Active  Participation Quality: Attentive  Affect: Appropriate  Cognitive: Oriented  Insight: Developing/Improving  Engagement in Therapy: Engaged  Modes of Intervention: Discussion, Education and Socialization  Summary of Progress/Problems: Mental Health Association (MHA) Speaker came to talk about his personal journey with substance abuse and addiction. The pt processed ways by which to relate to the speaker. MHA speaker provided handouts and educational information pertaining to groups and services offered by the MHA. Pt was engaged in speaker's presentation and was receptive to resources provided.    [place] Kirchner, LCSW 10/02/2016 1:28 PM  

## 2016-10-02 NOTE — Progress Notes (Signed)
[facility] MD Progress Note  10/02/2016 5:16 PM Teresa Hunter  MRN:  [id number] Subjective: patient continues to report significant depression, sadness, but denies any active suicidal ideations . Denies medication side effects, but does not feel medications are working significantly as of yet.  Objective:  I have discussed case with treatment team and have met with patient . As reviewed with staff, has continued to report depression, passive SI ( but denies any active suicidal or self injurious ideations) At this time patient continues to report depression, sense of sadness, but is presenting with a somewhat more reactive affect today and smiles appropriately during this session, also noted to be more conversant, increased verbal output. Denies medication side effects. We discussed medication options- she is agreeing to starting Abilify as an augmentation strategy . Continue Zoloft, which has been titrated gradually without any current side effects. No disruptive or agitated behaviors on unit, going to some groups  Principal Problem: MDD (major depressive disorder), recurrent severe, without psychosis ([place]) Diagnosis:   Patient Active Problem List   Diagnosis Date Noted  . Severe recurrent major depression without psychotic features ([place]) [F33.2] 09/28/2016  . MDD (major depressive disorder), recurrent severe, without psychosis ([place]) [F33.2] 09/28/2016  . Chronic cholecystitis [K81.1] 07/14/2012   Total Time spent with patient: 20 minutes   Past Psychiatric History: see H&P  Past Medical History:  Past Medical History:  Diagnosis Date  . Allergy   . Anxiety   . Asthma   . Family history of anesthesia complication    pt's mother slow to wake up   . GERD (gastroesophageal reflux disease)   . Hypertension 2005   stress related /weight fluctuates.   . Insomnia   . Migraine headache     Past Surgical History:  Procedure Laterality Date  . CHOLECYSTECTOMY  08/06/2012   Procedure:  LAPAROSCOPIC CHOLECYSTECTOMY;  Surgeon: Harl Bowie, MD;  Location: [place];  Service: General;  Laterality: N/A;  . COLONOSCOPY  2013 october- Dr Collene Mares   removal of polyps with endoscopy /colonoscopy  . EYE SURGERY     laser  . laser     B/L glaucoma  . TONSILLECTOMY     Family History:  Family History  Problem Relation Age of Onset  . Cancer Mother     multiple myeloma  . Hypertension Sister   . Cancer Maternal Grandmother   . Cancer Maternal Grandfather   . Hypertension Paternal Grandmother   . Cancer Paternal Grandfather    Family Psychiatric  History: see H&P Social History:  History  Alcohol Use No     History  Drug Use No    Social History   Social History  . Marital status: Married    Spouse name: N/A  . Number of children: N/A  . Years of education: N/A   Social History Main Topics  . Smoking status: Never Smoker  . Smokeless tobacco: Never Used  . Alcohol use No  . Drug use: No  . Sexual activity: Yes   Other Topics Concern  . None   Social History Narrative  . None   Additional Social History:   Sleep: improved   Appetite:  Improved   Current Medications: Current Facility-Administered Medications  Medication Dose Route Frequency Provider Last Rate Last Dose  . acetaminophen (TYLENOL) tablet 650 mg  650 mg Oral Q6H PRN Patrecia Pour, NP   650 mg at 09/29/16 0905  . albuterol (PROVENTIL HFA;VENTOLIN HFA) 108 (90 Base) MCG/ACT inhaler 1-2 puff  1-2 puff Inhalation Q6H PRN Patrecia Pour, NP      . albuterol (PROVENTIL) (2.5 MG/3ML) 0.083% nebulizer solution 3 mL  3 mL Inhalation Q6H PRN Patrecia Pour, NP      . alum & mag hydroxide-simeth (MAALOX/MYLANTA) 200-200-20 MG/5ML suspension 30 mL  30 mL Oral Q4H PRN Patrecia Pour, NP      . ARIPiprazole (ABILIFY) tablet 2 mg  2 mg Oral Daily Jenne Campus, MD   2 mg at 10/02/16 1651  . busPIRone (BUSPAR) tablet 7.5 mg  7.5 mg Oral BID Benjamine Mola, FNP   7.5 mg at 10/02/16 1651  .  cephALEXin (KEFLEX) capsule 500 mg  500 mg Oral Q12H Jenne Campus, MD   500 mg at 10/02/16 0803  . hydrOXYzine (ATARAX/VISTARIL) tablet 50 mg  50 mg Oral BID PRN Ursula Alert, MD   50 mg at 10/01/16 2204  . magnesium hydroxide (MILK OF MAGNESIA) suspension 30 mL  30 mL Oral Daily PRN Patrecia Pour, NP      . Derrill Memo ON 10/03/2016] sertraline (ZOLOFT) tablet 100 mg  100 mg Oral Daily Jenne Campus, MD      . traZODone (DESYREL) tablet 50 mg  50 mg Oral QHS PRN Jenne Campus, MD   50 mg at 10/01/16 2204    Lab Results:  Results for orders placed or performed during the hospital encounter of 09/28/16 (from the past 48 hour(s))  Culture, Urine     Status: Abnormal (Preliminary result)   Collection Time: 10/01/16  6:18 AM  Result Value Ref Range   Specimen Description      URINE, CLEAN CATCH Performed at [facility]      Normal Performed at [facility]    Culture (A)     >=100,000 COLONIES/mL ESCHERICHIA COLI SUSCEPTIBILITIES TO FOLLOW Performed at [facility]    Report Status PENDING     Blood Alcohol level:  Lab Results  Component Value Date   [facility] <5 93/57/0177    Metabolic Disorder Labs: No results found for: HGBA1C, MPG No results found for: PROLACTIN No results found for: CHOL, TRIG, HDL, CHOLHDL, VLDL, LDLCALC  Physical Findings: AIMS: Facial and Oral Movements Muscles of Facial Expression: None, normal Lips and Perioral Area: None, normal Jaw: None, normal Tongue: None, normal,Extremity Movements Upper (arms, wrists, hands, fingers): None, normal Lower (legs, knees, ankles, toes): None, normal, Trunk Movements Neck, shoulders, hips: None, normal, Overall Severity Severity of abnormal movements (highest score from questions above): None, normal Incapacitation due to abnormal movements: None, normal Patient's awareness of abnormal movements (rate only patient's report): No Awareness, Dental  Status Current problems with teeth and/or dentures?: No Does patient usually wear dentures?: No  CIWA:    COWS:     Musculoskeletal: Strength & Muscle Tone: within normal limits Gait & Station: normal Patient leans: N/A  Psychiatric Specialty Exam: Physical Exam  Review of Systems  Psychiatric/Behavioral: Positive for depression and suicidal ideas. Negative for hallucinations and substance abuse. The patient is nervous/anxious and has insomnia.   All other systems reviewed and are negative. no fever, no chills, reports improving suprapubic discomfort, denies dysuria.  Blood pressure (!) 110/57, pulse (!) 126, temperature 98.8 F (37.1 C), resp. rate 20, height '5\' 8"'  (1.727 m), weight 88.5 kg (195 lb).Body mass index is 29.65 kg/m.  General Appearance: Well Groomed  Eye Contact:  Good  Speech:  Normal Rate  Volume:  Normal  Mood:  Depressed, but slightly improved today  Affect:  Less severely constricted   Thought Process:  Linear and Descriptions of Associations: Intact  Orientation:  Full (Time, Place, and Person)  Thought Content:  Denies hallucinations, no delusions   Suicidal Thoughts:  Denies self injurious or  suicidal plan or intention, contracts for safety on unit, endorses some passive SI   Homicidal Thoughts:  No- denies homicidal or violent ideations  Memory:  Recent and remote grossly intact   Judgement:  Improving   Insight:  Improving   Psychomotor Activity:  Normal  Concentration:  Concentration: Good and Attention Span: Good  Recall:  Good  Fund of Knowledge:  Good  Language:  Good  Akathisia:  No  Handed:    AIMS (if indicated):     Assets:  Communication Skills Desire for Improvement Resilience Social Support  ADL's:  Intact  Cognition:  WNL  Sleep:  Number of Hours: 6.75    Assessment - patient remains depressed, sad, and has continued to have some passive SI, but denies any suicidal plan or intention. Today she is, however, presenting with some  improvement, noted by increased speech, and a somewhat less constricted/more reactive affect. She is agreeing to Abilify trial as an augmentation strategy to address her persistent depression. Of note, patient wants to simplify her medication regimen- she feels Neurontin is not helping much and possibly contributing to low energy. She wants to stop this medication, which is a new trial /was recently started  Treatment Plan Summary:   Medications:  -Encourage group and milieu participation to work on coping skills and symptom reduction -Continue Buspar 7.14m po bid for anxiety -D/C Neurontin- see rationale above  -Continue Trazodone 545mpo qhs prn for insomnia -Increase  Zoloft to 100 mg po daily for depression -Start Abilify 2 mgrs qday as antidepressant augmentation strategy. -Continue Keflex course for UTI -Treatment team working on disposition planning    CONeita GarnetMD 10/02/2016, 5:16 PM   Patient ID: LaQuentin Angstfemale   DOB: [birth year].o.   MRN: [id number]

## 2016-10-02 NOTE — Progress Notes (Signed)
Patient ID: Teresa Hunter, female   DOB: December 04, 1965, 51 y.o.   MRN: [id number] Standing in line waiting to go to the dinning room for dinner. Reported feeling hot and lightheaded, fearful she was going to faint. Tech brought her to the dayroom to sit, and Clinical research associatewriter took her vitals 108/67 and pulse 89. No other sx, and she had just taken first dose of Abilify about 30 min earlier. Got her a cup of grape juice and encouraged to continue to sit and eat what she could when a tray is brought to her. She has a history of stomach sleeve surgery and doesn't eat large amounts at one sitting. Is not feeling nauseous.

## 2016-10-02 NOTE — Progress Notes (Signed)
Patient ID: Teresa Hunter, female   DOB: [birth year], 51 y.o.   MRN: [id number] D-Self inventory completed and goal for today is to be positive. She rates how depressed she feels today as a 6 out of a 10 and fives on feelings of hopleessness and anxiety. At med pass this am, complained of fatigue and states is not a morning person and has spent most of the day in bed. She does endorse feelings of depression, but is able to contract for safety at this time.  A-Support offered. Monitored for safety. Medications as ordered. R-No complaints voiced. Slept or rested much of shift. Did not attend am nurse group.

## 2016-10-02 NOTE — Progress Notes (Signed)
Able to eat some of her fish and fruit for dinner, and drank the cup of grape juice. States is feeling better than she had earlier, but states she is still not at her baseline. Dr to assess concern tomorrow as it could be related to her new med: Abilify.

## 2016-10-02 NOTE — Plan of Care (Signed)
Problem: Safety: Goal: Periods of time without injury will increase Outcome: Progressing Patient remains safe on the unit at this time. Patient contracts for safety and is on q15 minute safety checks. Patient is a low falls risk; fall safety reviewed with patient. Patient able to express needs. Vital signs stable.     

## 2016-10-03 LAB — URINE CULTURE
Culture: >=100000 — AB
SPECIAL REQUESTS: NORMAL

## 2016-10-03 MED ORDER — ARIPIPRAZOLE 2 MG PO TABS
2.0000 mg | ORAL_TABLET | Freq: Every day | ORAL | Status: DC
Start: 2016-10-04 — End: 2016-10-08
  Administered 2016-10-04 – 2016-10-06 (×3): 2 mg via ORAL
  Filled 2016-10-03 (×7): qty 1

## 2016-10-03 NOTE — [facility] Group Notes (Signed)
[facility] LCSW Group Therapy 10/03/2016 1:15pm  Type of Therapy: Group Therapy- Feelings Around Relapse and Recovery  Participation Level: Minimal  Participation Quality:  Attentive but Reserved  Affect:  Flat  Cognitive: Alert and Oriented   Insight:  Developing   Engagement in Therapy: Developing/Improving and Engaged   Modes of Intervention: Clarification, Confrontation, Discussion, Education, Exploration, Limit-setting, Orientation, Problem-solving, Rapport Building, Dance movement psychotherapisteality Testing, Socialization and Support  Summary of Progress/Problems: The topic for today was feelings about relapse. The group discussed what relapse prevention is to them and identified triggers that they are on the path to relapse. Members also processed their feeling towards relapse and were able to relate to common experiences. Group also discussed coping skills that can be used for relapse prevention.  Pt was more reserved in group discussion today but remained attentive throughout. Pt was able to identify with another peer who discussed societal struggles due to ascription to the Muslim faith. Pt expressed that insults due to this are even more difficult to cope with when she is feeling depressed.   Therapeutic Modalities:   Cognitive Behavioral Therapy Solution-Focused Therapy Assertiveness Training Relapse Prevention Therapy    Damien FusiLauren Eain Mullendore, LCSW [phone] 10/03/2016 3:55 PM

## 2016-10-03 NOTE — Tx Team (Signed)
Interdisciplinary Treatment and Diagnostic Plan Update  10/03/2016 Time of Session: 11:35 AM  Chesley NoonLavonda Jolliff MRN: [id number]  Principal Diagnosis: MDD (major depressive disorder), recurrent severe, without psychosis ([facility])  Secondary Diagnoses: Principal Problem:   MDD (major depressive disorder), recurrent severe, without psychosis ([facility])   Current Medications:  Current Facility-Administered Medications  Medication Dose Route Frequency Provider Last Rate Last Dose  . acetaminophen (TYLENOL) tablet 650 mg  650 mg Oral Q6H PRN Charm RingsJamison Y Lord, NP   650 mg at 09/29/16 0905  . albuterol (PROVENTIL HFA;VENTOLIN HFA) 108 (90 Base) MCG/ACT inhaler 1-2 puff  1-2 puff Inhalation Q6H PRN Charm RingsJamison Y Lord, NP      . albuterol (PROVENTIL) (2.5 MG/3ML) 0.083% nebulizer solution 3 mL  3 mL Inhalation Q6H PRN Charm RingsJamison Y Lord, NP      . alum & mag hydroxide-simeth (MAALOX/MYLANTA) 200-200-20 MG/5ML suspension 30 mL  30 mL Oral Q4H PRN Charm RingsJamison Y Lord, NP      . ARIPiprazole (ABILIFY) tablet 2 mg  2 mg Oral Daily Craige CottaFernando A Cobos, MD   2 mg at 10/03/16 0824  . busPIRone (BUSPAR) tablet 7.5 mg  7.5 mg Oral BID Beau FannyJohn C Withrow, FNP   7.5 mg at 10/03/16 0825  . cephALEXin (KEFLEX) capsule 500 mg  500 mg Oral Q12H Rockey SituFernando A Cobos, MD   500 mg at 10/03/16 0825  . hydrOXYzine (ATARAX/VISTARIL) tablet 50 mg  50 mg Oral BID PRN Jomarie LongsSaramma Eappen, MD   50 mg at 10/02/16 2202  . magnesium hydroxide (MILK OF MAGNESIA) suspension 30 mL  30 mL Oral Daily PRN Charm RingsJamison Y Lord, NP      . sertraline (ZOLOFT) tablet 100 mg  100 mg Oral Daily Craige CottaFernando A Cobos, MD   100 mg at 10/03/16 0825  . traZODone (DESYREL) tablet 50 mg  50 mg Oral QHS PRN Craige CottaFernando A Cobos, MD   50 mg at 10/02/16 2202    PTA Medications: Prescriptions Prior to Admission  Medication Sig Dispense Refill Last Dose  . albuterol (PROVENTIL HFA;VENTOLIN HFA) 108 (90 BASE) MCG/ACT inhaler Inhale 1-2 puffs into the lungs every 6 (six) hours as needed for wheezing or  shortness of breath. (Patient not taking: Reported on 09/28/2016) 1 Inhaler 0 Not Taking at Unknown time  . clonazePAM (KLONOPIN) 0.5 MG tablet Take 0.25 mg by mouth daily as needed for anxiety.   Past Week at Unknown time  . dicyclomine (BENTYL) 20 MG tablet Take 20 mg by mouth daily.   Past Month at Unknown time  . enoxaparin (LOVENOX) 150 MG/ML injection 0.9 mL (=130mg  = 1 mg/kg)  q12 hrs until directed by physician AND INR therapeutic (Patient not taking: Reported on 09/28/2016) 14 mL 1 Not Taking at Unknown time  . ibuprofen (ADVIL,MOTRIN) 200 MG tablet Take 400 mg by mouth every 8 (eight) hours as needed for headache.   Past Week at Unknown time  . meloxicam (MOBIC) 7.5 MG tablet Take 1 tablet (7.5 mg total) by mouth daily. May take 2 if needed (Patient not taking: Reported on 09/28/2016) 60 tablet 1 Not Taking at Unknown time  . Multiple Vitamins-Minerals (THERA-M) TABS Take 1 tablet by mouth daily.   Past Month at Unknown time  . phentermine 37.5 MG capsule Take 37.5 mg by mouth every morning.   Not Taking at Unknown time  . warfarin (COUMADIN) 5 MG tablet Take 1 tablet (5 mg total) by mouth daily. (Patient not taking: Reported on 09/28/2016) 30 tablet 0 Not Taking at Unknown  time    Treatment Modalities: Medication Management, Group therapy, Case management,  1 to 1 session with clinician, Psychoeducation, Recreational therapy.  Patient Stressors: Marital or family conflict Medication change or noncompliance  Patient Strengths: Ability for insight Average or above average intelligence Capable of independent living Wellsite geologist fund of knowledge Motivation for treatment/growth  Physician Treatment Plan for Primary Diagnosis: MDD (major depressive disorder), recurrent severe, without psychosis (H[facility]) Long Term Goal(s): Improvement in symptoms so as ready for discharge  Short Term Goals: Ability to identify changes in lifestyle to reduce recurrence of condition will  improve Ability to verbalize feelings will improve Ability to disclose and discuss suicidal ideas Ability to demonstrate self-control will improve Ability to identify and develop effective coping behaviors will improve Ability to maintain clinical measurements within normal limits will improve Compliance with prescribed medications will improve Ability to identify changes in lifestyle to reduce recurrence of condition will improve Ability to verbalize feelings will improve Ability to disclose and discuss suicidal ideas Ability to demonstrate self-control will improve Ability to identify and develop effective coping behaviors will improve Ability to maintain clinical measurements within normal limits will improve Compliance with prescribed medications will improve  Medication Management: Evaluate patient's response, side effects, and tolerance of medication regimen.  Therapeutic Interventions: 1 to 1 sessions, Unit Group sessions and Medication administration.  Evaluation of Outcomes: Progressing  Physician Treatment Plan for Secondary Diagnosis: Principal Problem:   MDD (major depressive disorder), recurrent severe, without psychosis (HCC)   Long Term Goal(s): Improvement in symptoms so as ready for discharge  Short Term Goals: Ability to identify changes in lifestyle to reduce recurrence of condition will improve Ability to verbalize feelings will improve Ability to disclose and discuss suicidal ideas Ability to demonstrate self-control will improve Ability to identify and develop effective coping behaviors will improve Ability to maintain clinical measurements within normal limits will improve Compliance with prescribed medications will improve Ability to identify changes in lifestyle to reduce recurrence of condition will improve Ability to verbalize feelings will improve Ability to disclose and discuss suicidal ideas Ability to demonstrate self-control will improve Ability  to identify and develop effective coping behaviors will improve Ability to maintain clinical measurements within normal limits will improve Compliance with prescribed medications will improve  Medication Management: Evaluate patient's response, side effects, and tolerance of medication regimen.  Therapeutic Interventions: 1 to 1 sessions, Unit Group sessions and Medication administration.  Evaluation of Outcomes: Progressing   RN Treatment Plan for Primary Diagnosis: MDD (major depressive disorder), recurrent severe, without psychosis (HCC) Long Term Goal(s): Knowledge of disease and therapeutic regimen to maintain health will improve  Short Term Goals: Ability to verbalize feelings will improve, Ability to disclose and discuss suicidal ideas and Ability to identify and develop effective coping behaviors will improve  Medication Management: RN will administer medications as ordered by provider, will assess and evaluate patient's response and provide education to patient for prescribed medication. RN will report any adverse and/or side effects to prescribing provider.  Therapeutic Interventions: 1 on 1 counseling sessions, Psychoeducation, Medication administration, Evaluate responses to treatment, Monitor vital signs and CBGs as ordered, Perform/monitor CIWA, COWS, AIMS and Fall Risk screenings as ordered, Perform wound care treatments as ordered.  Evaluation of Outcomes: Progressing   LCSW Treatment Plan for Primary Diagnosis: MDD (major depressive disorder), recurrent severe, without psychosis (HCC) Long Term Goal(s): Safe transition to appropriate next level of care at discharge, Engage patient in therapeutic group addressing interpersonal concerns.  Short Term  Goals: Engage patient in aftercare planning with referrals and resources, Identify triggers associated with mental health/substance abuse issues and Increase skills for wellness and recovery  Therapeutic Interventions: Assess  for all discharge needs, 1 to 1 time with Social worker, Explore available resources and support systems, Assess for adequacy in community support network, Educate family and significant other(s) on suicide prevention, Complete Psychosocial Assessment, Interpersonal group therapy.  Evaluation of Outcomes: Progressing   Progress in Treatment: Attending groups: Yes  Participating in groups: Yes Taking medication as prescribed: Yes, MD continues to assess for medication changes as needed Toleration medication: Yes, no side effects reported at this time Family/Significant other contact made: Yes with husband Patient understands diagnosis: Yes AEB willingness to participate in treatment Discussing patient identified problems/goals with staff: Yes Medical problems stabilized or resolved: Yes Denies suicidal/homicidal ideation: No, endorses passive SI Issues/concerns per patient self-inventory: None Other: N/A  New problem(s) identified: None identified at this time.   New Short Term/Long Term Goal(s): None identified at this time.   Discharge Plan or Barriers: Pt plans to go live with her father and follow-up with outpatient resources   Reason for Continuation of Hospitalization: Anxiety Depression Medication stabilization Suicidal ideation  Estimated Length of Stay: 2-3 days  Attendees: Patient: 10/03/2016  11:35 AM  Physician: Dr. Elna Breslow, MD; Dr. Jama Flavors 10/03/2016  11:35 AM  Nursing: Liborio Nixon, RN; Leighton Parody, RN 10/03/2016  11:35 AM  RN Care Manager: Onnie Boer, RN 10/03/2016  11:35 AM  Social Worker: Vernie Shanks, LCSW; Heather Smart, LCSW 10/03/2016  11:35 AM  Recreational Therapist:  10/03/2016  11:35 AM  Other: Armandina Stammer, NP 10/03/2016  11:35 AM  Other:  10/03/2016  11:35 AM  Other: 10/03/2016  11:35 AM    Scribe for Treatment Team: [name], LCSW 10/03/2016 11:35 AM

## 2016-10-03 NOTE — Progress Notes (Signed)
Recreation Therapy Notes  Date: 10/03/16 Time: 0930 Location: [address] Dayroom  Group Topic: Stress Management  Goal Area(s) Addresses:  Patient will verbalize importance of using healthy stress management.  Patient will identify positive emotions associated with healthy stress management.   Intervention:  Stress Management  Activity :  [facility].  LRT introduced the stress management technique of guided imagery to patients.  LRT read a script to guide patients through the technique so they could engage in the process.  Patients were to follow along as LRT read script.  Education:  Stress Management, Discharge Planning.   Education Outcome: Acknowledges edcuation/In group clarification offered/Needs additional education  Clinical Observations/Feedback:  Pt did not attend group.     Caroll RancherMarjette Lynsey Ange, LRT/CTRS         Caroll RancherLindsay, Adair Lauderback A 10/03/2016 11:27 AM

## 2016-10-03 NOTE — Progress Notes (Signed)
Adult Psychoeducational Group Note  Date:  10/03/2016 Time:  9:24 PM  Group Topic/Focus:  Wrap-Up Group:   The focus of this group is to help patients review their daily goal of treatment and discuss progress on daily workbooks.   Participation Level:  Active  Participation Quality:  Appropriate  Affect:  Appropriate  Cognitive:  Alert, Appropriate and Oriented  Insight: Appropriate  Engagement in Group:  Engaged  Modes of Intervention:  Discussion  Additional Comments:  Patient attended group and said her day was a 9.  Something positive for the day was she had a great conversation with her husband.  Jerita Wimbush W Juventino Pavone 10/03/2016, 9:24 PM

## 2016-10-03 NOTE — Progress Notes (Signed)
D: Pt appears brighter on approach today. Pt reports decreasing depression. High anxiety. Passive suicidal thoughts with no plan or intent. Pt verbally contracts for safety. Pt reported that she will go live with her father once she is discharged home. Pt stated that she doesn't feel safe staying home alone because her husband travels for work. Pt afraid of returning home alone considering that she takes several medications. Pt feels safer living with a family member. Pt stated that she will go live with her father temporarily until she feels stable enough to live alone. A: Medications reviewed with pt. Medications administered as ordered per MD. Verbal support provided. 15 minute checks performed for safety.  R: Pt receptive to tx.

## 2016-10-03 NOTE — Progress Notes (Signed)
[facility] MD Progress Note  10/03/2016 3:49 PM Teresa Hunter  MRN:  [id number] Subjective:  Patient continues to report significant depression, sadness, but denies any active suicidal ideations.  Reports that the Abilify made her somewhat dizzy.  Will change frequency to night.  Objective:  I have discussed case with treatment team and have met with patient . As reviewed with staff, has continued to report depression, passive SI (but denies any active suicidal or self injurious ideations). At this time patient continues to report depression, sense of sadness, but is presenting with a somewhat more reactive affect today and smiles appropriately during this session, also noted to be more conversant, increased verbal output.  Participating in groups Denies medication side effects. Continue Zoloft, which has been titrated gradually without any current side effects. No disruptive or agitated behaviors on unit, going to some groups  Principal Problem: MDD (major depressive disorder), recurrent severe, without psychosis ([place]) Diagnosis:   Patient Active Problem List   Diagnosis Date Noted  . Severe recurrent major depression without psychotic features ([place]) [F33.2] 09/28/2016  . MDD (major depressive disorder), recurrent severe, without psychosis ([place]) [F33.2] 09/28/2016  . Chronic cholecystitis [K81.1] 07/14/2012   Total Time spent with patient: 20 minutes   Past Psychiatric History: see H&P  Past Medical History:  Past Medical History:  Diagnosis Date  . Allergy   . Anxiety   . Asthma   . Family history of anesthesia complication    pt's mother slow to wake up   . GERD (gastroesophageal reflux disease)   . Hypertension 2005   stress related /weight fluctuates.   . Insomnia   . Migraine headache     Past Surgical History:  Procedure Laterality Date  . CHOLECYSTECTOMY  08/06/2012   Procedure: LAPAROSCOPIC CHOLECYSTECTOMY;  Surgeon: Harl Bowie, MD;  Location: [place];  Service:  General;  Laterality: N/A;  . COLONOSCOPY  2013 october- Dr Collene Mares   removal of polyps with endoscopy /colonoscopy  . EYE SURGERY     laser  . laser     B/L glaucoma  . TONSILLECTOMY     Family History:  Family History  Problem Relation Age of Onset  . Cancer Mother     multiple myeloma  . Hypertension Sister   . Cancer Maternal Grandmother   . Cancer Maternal Grandfather   . Hypertension Paternal Grandmother   . Cancer Paternal Grandfather    Family Psychiatric  History: see H&P Social History:  History  Alcohol Use No     History  Drug Use No    Social History   Social History  . Marital status: Married    Spouse name: N/A  . Number of children: N/A  . Years of education: N/A   Social History Main Topics  . Smoking status: Never Smoker  . Smokeless tobacco: Never Used  . Alcohol use No  . Drug use: No  . Sexual activity: Yes   Other Topics Concern  . None   Social History Narrative  . None   Additional Social History:   Sleep: improved   Appetite:  Improved   Current Medications: Current Facility-Administered Medications  Medication Dose Route Frequency Provider Last Rate Last Dose  . acetaminophen (TYLENOL) tablet 650 mg  650 mg Oral Q6H PRN Patrecia Pour, NP   650 mg at 09/29/16 0905  . albuterol (PROVENTIL HFA;VENTOLIN HFA) 108 (90 Base) MCG/ACT inhaler 1-2 puff  1-2 puff Inhalation Q6H PRN Patrecia Pour, NP      .  albuterol (PROVENTIL) (2.5 MG/3ML) 0.083% nebulizer solution 3 mL  3 mL Inhalation Q6H PRN Patrecia Pour, NP      . alum & mag hydroxide-simeth (MAALOX/MYLANTA) 200-200-20 MG/5ML suspension 30 mL  30 mL Oral Q4H PRN Patrecia Pour, NP      . Derrill Memo ON 10/04/2016] ARIPiprazole (ABILIFY) tablet 2 mg  2 mg Oral QHS Kerrie Buffalo, NP      . busPIRone (BUSPAR) tablet 7.5 mg  7.5 mg Oral BID Benjamine Mola, FNP   7.5 mg at 10/03/16 0825  . cephALEXin (KEFLEX) capsule 500 mg  500 mg Oral Q12H Myer Peer Lemma Tetro, MD   500 mg at 10/03/16 0825  .  hydrOXYzine (ATARAX/VISTARIL) tablet 50 mg  50 mg Oral BID PRN Ursula Alert, MD   50 mg at 10/02/16 2202  . magnesium hydroxide (MILK OF MAGNESIA) suspension 30 mL  30 mL Oral Daily PRN Patrecia Pour, NP      . sertraline (ZOLOFT) tablet 100 mg  100 mg Oral Daily Jenne Campus, MD   100 mg at 10/03/16 0825  . traZODone (DESYREL) tablet 50 mg  50 mg Oral QHS PRN Jenne Campus, MD   50 mg at 10/02/16 2202    Lab Results:  No results found for this or any previous visit (from the past 11 hour(s)).  Blood Alcohol level:  Lab Results  Component Value Date   ETH <5 50/53/9767    Metabolic Disorder Labs: No results found for: HGBA1C, MPG No results found for: PROLACTIN No results found for: CHOL, TRIG, HDL, CHOLHDL, VLDL, LDLCALC  Physical Findings: AIMS: Facial and Oral Movements Muscles of Facial Expression: None, normal Lips and Perioral Area: None, normal Jaw: None, normal Tongue: None, normal,Extremity Movements Upper (arms, wrists, hands, fingers): None, normal Lower (legs, knees, ankles, toes): None, normal, Trunk Movements Neck, shoulders, hips: None, normal, Overall Severity Severity of abnormal movements (highest score from questions above): None, normal Incapacitation due to abnormal movements: None, normal Patient's awareness of abnormal movements (rate only patient's report): No Awareness, Dental Status Current problems with teeth and/or dentures?: No Does patient usually wear dentures?: No  CIWA:    COWS:     Musculoskeletal: Strength & Muscle Tone: within normal limits Gait & Station: normal Patient leans: N/A  Psychiatric Specialty Exam: Physical Exam  Review of Systems  Psychiatric/Behavioral: Positive for depression and suicidal ideas. Negative for hallucinations and substance abuse. The patient is nervous/anxious and has insomnia.   All other systems reviewed and are negative. no fever, no chills, reports improving suprapubic discomfort, denies  dysuria.  Blood pressure 113/89, pulse (!) 108, temperature 98.7 F (37.1 C), temperature source Oral, resp. rate 16, height _0  (1.727 m), weight 88.5 kg (195 lb).Body mass index is 29.65 kg/m.  General Appearance: Well Groomed  Eye Contact:  Good  Speech:  Normal Rate  Volume:  Normal  Mood:  Depressed, but slightly improved today  Affect:  Less severely constricted   Thought Process:  Linear and Descriptions of Associations: Intact  Orientation:  Full (Time, Place, and Person)  Thought Content:  Denies hallucinations, no delusions   Suicidal Thoughts:  Denies self injurious or  suicidal plan or intention, contracts for safety on unit, endorses some passive SI   Homicidal Thoughts:  No- denies homicidal or violent ideations  Memory:  Recent and remote grossly intact   Judgement:  Improving   Insight:  Improving   Psychomotor Activity:  Normal  Concentration:  Concentration:  Good and Attention Span: Good  Recall:  Good  Fund of Knowledge:  Good  Language:  Good  Akathisia:  No  Handed:    AIMS (if indicated):     Assets:  Communication Skills Desire for Improvement Resilience Social Support  ADL's:  Intact  Cognition:  WNL  Sleep:  Number of Hours: 6.75    Assessment - patient remains depressed, sad, and has continued to have some passive SI, but denies any suicidal plan or intention. Today she is, however, presenting with some improvement, noted by increased speech, and a somewhat less constricted/more reactive affect. She is agreeing to Abilify trial as an augmentation strategy to address her persistent depression. Of note, patient wants to simplify her medication regimen- she feels Neurontin is not helping much and possibly contributing to low energy. She wants to stop this medication, which is a new trial /was recently started   Treatment Plan Summary: -Encourage group and milieu participation to work on coping skills and symptom reduction -Continue Buspar 7.38m po bid  for anxiety -D/C Neurontin- see rationale above  -Continue Trazodone 554mpo qhs prn for insomnia -Increase  Zoloft to 100 mg po daily for depression -Change the Abilify 2 mgrs to night dose as antidepressant augmentation strategy. -Continue Keflex course for UTI -Treatment team working on disposition plPepinNP [facility]University [facility]/01/2017, 3:49 PM  Agree with NP Progress  Note

## 2016-10-04 NOTE — [facility] Group Notes (Signed)
Adult Therapy Group Note  Date:  10/04/2016 Time:  10:00-11:15AM Group Topic/Focus: Compare and contrast patients' current "I am...." statements to the visions patients identified as desirable for their lives.  This elicited discussion about patient fears and how they can go about making positive changes in their cognitions that will positively impact their behaviors.  Many expressions of similarities and mutual support were provided among group members.  A motivational 3-minute speech was played and patients were left with the task of thinking about what "I am...." statements they can start using in their lives immediately.  Participation Level:  Active  Participation Quality:  Attentive, Sharing and Supportive  Affect:  Appropriate  Cognitive:  Appropriate  Insight: Good  Engagement in Group:  Engaged  Modes of Intervention:  Activity, Discussion and Support  Additional Comments:  Pt did not speak often in group, but was attentive and supportive of others with nods.  When she did speak up, it was on topic and helpful to forwarding the discussion.  Teresa Hunter 10/04/2016, 1:57 PM

## 2016-10-04 NOTE — Progress Notes (Signed)
D: Pt was in the day room upon initial approach.  Pt presents with depressed affect and mood.  Pt reports she had a good visit with her "niece, daughters, and sister."  Pt reports she "got to talk to them and be candid about my illness and things I've been dealing with, for years I've been a manipulator."  Pt states "I want to get better."  Pt reports passive SI.  Pt denies HI, denies hallucinations, denies pain.  Pt has been visible in milieu interacting with peers and staff appropriately.  Pt attended evening group.   A: Introduced self to pt.  Actively listened to pt and offered support and encouragement. Medications administered per order.  PRN medication administered for sleep. R: Pt is safe on the unit.  Pt is compliant with medications.  Pt verbally contracts for safety.  Will continue to monitor and assess.

## 2016-10-04 NOTE — Progress Notes (Signed)
Adult Psychoeducational Group Note  Date:  10/04/2016 Time:  10:34 PM  Group Topic/Focus:  Wrap-Up Group:   The focus of this group is to help patients review their daily goal of treatment and discuss progress on daily workbooks.   Participation Level:  Active  Participation Quality:  Appropriate  Affect:  Appropriate  Cognitive:  Appropriate  Insight: Appropriate  Engagement in Group:  Engaged  Modes of Intervention:  Discussion  Additional Comments:  Patient attended group and said that her day was a 10.  She had an exciting visit from her daughter and sister.  Toleen Lachapelle W Emari Demmer 10/04/2016, 10:34 PM

## 2016-10-04 NOTE — Progress Notes (Signed)
[facility] MD Progress Note  10/04/2016 12:16 PM Carlyne Keehan  MRN:  [id number] Subjective:  Patient continues to report depression . abilify has been changed to night time for not having dizziness. Overall some improvement in mood  Objective:  I have discussed case with treatment team and have met with patient . As reviewed with staff, has continued to report depression not having suicidal toughts. But remains passive, sad Denies medication side effects. Continue Zoloft, which has been titrated gradually without any current side effects. No disruptive or agitated behaviors on unit, going to some groups  Principal Problem: MDD (major depressive disorder), recurrent severe, without psychosis ([place]) Diagnosis:   Patient Active Problem List   Diagnosis Date Noted  . Severe recurrent major depression without psychotic features ([place]) [F33.2] 09/28/2016  . MDD (major depressive disorder), recurrent severe, without psychosis ([place]) [F33.2] 09/28/2016  . Chronic cholecystitis [K81.1] 07/14/2012   Total Time spent with patient: 20 minutes   Past Psychiatric History: see H&P  Past Medical History:  Past Medical History:  Diagnosis Date  . Allergy   . Anxiety   . Asthma   . Family history of anesthesia complication    pt's mother slow to wake up   . GERD (gastroesophageal reflux disease)   . Hypertension 2005   stress related /weight fluctuates.   . Insomnia   . Migraine headache     Past Surgical History:  Procedure Laterality Date  . CHOLECYSTECTOMY  08/06/2012   Procedure: LAPAROSCOPIC CHOLECYSTECTOMY;  Surgeon: Harl Bowie, MD;  Location: [place];  Service: General;  Laterality: N/A;  . COLONOSCOPY  2013 october- Dr Collene Mares   removal of polyps with endoscopy /colonoscopy  . EYE SURGERY     laser  . laser     B/L glaucoma  . TONSILLECTOMY     Family History:  Family History  Problem Relation Age of Onset  . Cancer Mother     multiple myeloma  . Hypertension Sister   . Cancer  Maternal Grandmother   . Cancer Maternal Grandfather   . Hypertension Paternal Grandmother   . Cancer Paternal Grandfather    Family Psychiatric  History: see H&P Social History:  History  Alcohol Use No     History  Drug Use No    Social History   Social History  . Marital status: Married    Spouse name: N/A  . Number of children: N/A  . Years of education: N/A   Social History Main Topics  . Smoking status: Never Smoker  . Smokeless tobacco: Never Used  . Alcohol use No  . Drug use: No  . Sexual activity: Yes   Other Topics Concern  . None   Social History Narrative  . None   Additional Social History:   Sleep: improved   Appetite:  Improved   Current Medications: Current Facility-Administered Medications  Medication Dose Route Frequency Provider Last Rate Last Dose  . acetaminophen (TYLENOL) tablet 650 mg  650 mg Oral Q6H PRN Patrecia Pour, NP   650 mg at 09/29/16 0905  . albuterol (PROVENTIL HFA;VENTOLIN HFA) 108 (90 Base) MCG/ACT inhaler 1-2 puff  1-2 puff Inhalation Q6H PRN Patrecia Pour, NP      . albuterol (PROVENTIL) (2.5 MG/3ML) 0.083% nebulizer solution 3 mL  3 mL Inhalation Q6H PRN Patrecia Pour, NP      . alum & mag hydroxide-simeth (MAALOX/MYLANTA) 200-200-20 MG/5ML suspension 30 mL  30 mL Oral Q4H PRN Patrecia Pour, NP      .  ARIPiprazole (ABILIFY) tablet 2 mg  2 mg Oral QHS Kerrie Buffalo, NP      . busPIRone (BUSPAR) tablet 7.5 mg  7.5 mg Oral BID Benjamine Mola, FNP   7.5 mg at 10/04/16 0805  . cephALEXin (KEFLEX) capsule 500 mg  500 mg Oral Q12H Jenne Campus, MD   500 mg at 10/04/16 0805  . hydrOXYzine (ATARAX/VISTARIL) tablet 50 mg  50 mg Oral BID PRN Ursula Alert, MD   50 mg at 10/03/16 2059  . magnesium hydroxide (MILK OF MAGNESIA) suspension 30 mL  30 mL Oral Daily PRN Patrecia Pour, NP      . sertraline (ZOLOFT) tablet 100 mg  100 mg Oral Daily Jenne Campus, MD   100 mg at 10/04/16 0806  . traZODone (DESYREL) tablet 50 mg  50  mg Oral QHS PRN Jenne Campus, MD   50 mg at 10/03/16 2056    Lab Results:  No results found for this or any previous visit (from the past 48 hour(s)).  Blood Alcohol level:  Lab Results  Component Value Date   ETH <5 25/95/6387    Metabolic Disorder Labs: No results found for: HGBA1C, MPG No results found for: PROLACTIN No results found for: CHOL, TRIG, HDL, CHOLHDL, VLDL, LDLCALC  Physical Findings: AIMS: Facial and Oral Movements Muscles of Facial Expression: None, normal Lips and Perioral Area: None, normal Jaw: None, normal Tongue: None, normal,Extremity Movements Upper (arms, wrists, hands, fingers): None, normal Lower (legs, knees, ankles, toes): None, normal, Trunk Movements Neck, shoulders, hips: None, normal, Overall Severity Severity of abnormal movements (highest score from questions above): None, normal Incapacitation due to abnormal movements: None, normal Patient's awareness of abnormal movements (rate only patient's report): No Awareness, Dental Status Current problems with teeth and/or dentures?: No Does patient usually wear dentures?: No  CIWA:    COWS:       Psychiatric Specialty Exam: Physical Exam  Constitutional: She appears well-developed.  HENT:  Head: Normocephalic.    Review of Systems  Cardiovascular: Negative for chest pain.  Gastrointestinal: Negative for nausea.  Psychiatric/Behavioral: Positive for depression. Negative for hallucinations and substance abuse. The patient is nervous/anxious.   All other systems reviewed and are negative. no fever, no chills, reports improving suprapubic discomfort, denies dysuria.  Blood pressure 116/64, pulse 91, temperature 97.5 F (36.4 C), temperature source Oral, resp. rate 18, height '5\' 8"'  (1.727 m), weight 88.5 kg (195 lb).Body mass index is 29.65 kg/m.  General Appearance: Well Groomed  Eye Contact:  Good  Speech:  Normal Rate  Volume:  Normal  Mood: sad , amotivated  Affect:  Less  severely constricted   Thought Process:  Linear and Descriptions of Associations: Intact  Orientation:  Full (Time, Place, and Person)  Thought Content:  Denies hallucinations, no delusions   Suicidal Thoughts:  Denies self injurious or  suicidal plan or intention, contracts for safety on unit, endorses some passive SI   Homicidal Thoughts:  No- denies homicidal or violent ideations  Memory:  Recent and remote grossly intact   Judgement:  Improving   Insight:  Improving   Psychomotor Activity:  Normal  Concentration:  Concentration: Good and Attention Span: Good  Recall:  Good  Fund of Knowledge:  Good  Language:  Good  Akathisia:  No  Handed:    AIMS (if indicated):     Assets:  Communication Skills Desire for Improvement Resilience Social Support  ADL's:  Intact  Cognition:  WNL  Sleep:  Number of Hours: 6.75      Treatment Plan Summary: -Encourage group and milieu participation to work on coping skills and symptom reduction  no medication change done today. Tolerating current dose . Will evaluate dizziness prior to further increasing abilify -Continue Buspar 7.1m po bid for anxiety  -Continue Trazodone 522mpo qhs prn for insomnia -continue Zoloft  100 mg po daily for depression -Change the Abilify 2 mgrs to night dose as antidepressant augmentation strategy. -Continue Keflex course for UTI -Treatment team working on disposition planning    AKMerian CapronMD  10/04/2016, 12:16 PM

## 2016-10-04 NOTE — Plan of Care (Signed)
Problem: Medication: Goal: Compliance with prescribed medication regimen will improve Outcome: Progressing Pt has been compliant with medications tonight.    

## 2016-10-04 NOTE — Progress Notes (Signed)
D: Pt presents with flat affect. Pt reports depression 5/10. Anxiety 5/10. Pt endorses passive suicidal thoughts with no plan or intent. Pt stated that she feels tired or living and just want to relax. Pt stated that it's a battle in her head between positive vs negative thoughts. Pt stated that she's afraid of what she might do if the negative thoughts overpower the positive thoughts. Pt verbally contracts for safety with Clinical research associatewriter. A: Medications reviewed with pt. Medications administered as ordered per MD. Verbal support provided. Pt encouraged to attend groups. 15 minute checks performed for safety.  R: Pt receptive to tx. Pt compliant with tx. Pt verbalized understanding of med regimen.

## 2016-10-04 NOTE — Progress Notes (Signed)
Patient ID: Teresa NoonLavonda Portales, female   DOB: 08/05/1966, 51 y.o.   MRN: [id number]   D: Patient pleasant on approach tonight. Reports feeling anxious tonight. Still has some passive SI but contracts for safety. Interacting well with peers tonight. A: Staff will monitor on q 15 minute checks, follow treatment plan, and give medications as ordered. R: Cooperative on the unit.

## 2016-10-05 NOTE — Progress Notes (Signed)
[facility] MD Progress Note  10/05/2016 11:27 AM Teresa Hunter  MRN:  [id number] Subjective:  Patient continues to have some depression but improved since yesterday. No dizziness with abilify at night  Objective:  I have discussed case with treatment team and have met with patient . As reviewed with staff, has continued to report depression not having suicidal toughts. But remains passive, sad Denies medication side effects. Continue Zoloft, which has been titrated gradually without any current side effects. No disruptive or agitated behaviors on unit, going to some groups Tolerating abilify better. Mood is improving.   Principal Problem: MDD (major depressive disorder), recurrent severe, without psychosis ([place]) Diagnosis:   Patient Active Problem List   Diagnosis Date Noted  . Severe recurrent major depression without psychotic features ([place]) [F33.2] 09/28/2016  . MDD (major depressive disorder), recurrent severe, without psychosis ([place]) [F33.2] 09/28/2016  . Chronic cholecystitis [K81.1] 07/14/2012   Total Time spent with patient: 20 minutes   Past Psychiatric History: see H&P  Past Medical History:  Past Medical History:  Diagnosis Date  . Allergy   . Anxiety   . Asthma   . Family history of anesthesia complication    pt's mother slow to wake up   . GERD (gastroesophageal reflux disease)   . Hypertension 2005   stress related /weight fluctuates.   . Insomnia   . Migraine headache     Past Surgical History:  Procedure Laterality Date  . CHOLECYSTECTOMY  08/06/2012   Procedure: LAPAROSCOPIC CHOLECYSTECTOMY;  Surgeon: Harl Bowie, MD;  Location: [place];  Service: General;  Laterality: N/A;  . COLONOSCOPY  2013 october- Dr Collene Mares   removal of polyps with endoscopy /colonoscopy  . EYE SURGERY     laser  . laser     B/L glaucoma  . TONSILLECTOMY     Family History:  Family History  Problem Relation Age of Onset  . Cancer Mother     multiple myeloma  . Hypertension  Sister   . Cancer Maternal Grandmother   . Cancer Maternal Grandfather   . Hypertension Paternal Grandmother   . Cancer Paternal Grandfather    Family Psychiatric  History: see H&P Social History:  History  Alcohol Use No     History  Drug Use No    Social History   Social History  . Marital status: Married    Spouse name: N/A  . Number of children: N/A  . Years of education: N/A   Social History Main Topics  . Smoking status: Never Smoker  . Smokeless tobacco: Never Used  . Alcohol use No  . Drug use: No  . Sexual activity: Yes   Other Topics Concern  . None   Social History Narrative  . None   Additional Social History:   Sleep: improved   Appetite:  Improved   Current Medications: Current Facility-Administered Medications  Medication Dose Route Frequency Provider Last Rate Last Dose  . acetaminophen (TYLENOL) tablet 650 mg  650 mg Oral Q6H PRN Patrecia Pour, NP   650 mg at 09/29/16 0905  . albuterol (PROVENTIL HFA;VENTOLIN HFA) 108 (90 Base) MCG/ACT inhaler 1-2 puff  1-2 puff Inhalation Q6H PRN Patrecia Pour, NP      . albuterol (PROVENTIL) (2.5 MG/3ML) 0.083% nebulizer solution 3 mL  3 mL Inhalation Q6H PRN Patrecia Pour, NP      . alum & mag hydroxide-simeth (MAALOX/MYLANTA) 200-200-20 MG/5ML suspension 30 mL  30 mL Oral Q4H PRN Patrecia Pour, NP      .  ARIPiprazole (ABILIFY) tablet 2 mg  2 mg Oral QHS Kerrie Buffalo, NP   2 mg at 10/04/16 2110  . busPIRone (BUSPAR) tablet 7.5 mg  7.5 mg Oral BID Benjamine Mola, FNP   7.5 mg at 10/05/16 0851  . cephALEXin (KEFLEX) capsule 500 mg  500 mg Oral Q12H Jenne Campus, MD   500 mg at 10/05/16 0851  . hydrOXYzine (ATARAX/VISTARIL) tablet 50 mg  50 mg Oral BID PRN Ursula Alert, MD   50 mg at 10/03/16 2059  . magnesium hydroxide (MILK OF MAGNESIA) suspension 30 mL  30 mL Oral Daily PRN Patrecia Pour, NP      . sertraline (ZOLOFT) tablet 100 mg  100 mg Oral Daily Jenne Campus, MD   100 mg at 10/05/16 0851   . traZODone (DESYREL) tablet 50 mg  50 mg Oral QHS PRN Jenne Campus, MD   50 mg at 10/04/16 2110    Lab Results:  No results found for this or any previous visit (from the past 48 hour(s)).  Blood Alcohol level:  Lab Results  Component Value Date   ETH <5 54/56/2563    Metabolic Disorder Labs: No results found for: HGBA1C, MPG No results found for: PROLACTIN No results found for: CHOL, TRIG, HDL, CHOLHDL, VLDL, LDLCALC  Physical Findings: AIMS: Facial and Oral Movements Muscles of Facial Expression: None, normal Lips and Perioral Area: None, normal Jaw: None, normal Tongue: None, normal,Extremity Movements Upper (arms, wrists, hands, fingers): None, normal Lower (legs, knees, ankles, toes): None, normal, Trunk Movements Neck, shoulders, hips: None, normal, Overall Severity Severity of abnormal movements (highest score from questions above): None, normal Incapacitation due to abnormal movements: None, normal Patient's awareness of abnormal movements (rate only patient's report): No Awareness, Dental Status Current problems with teeth and/or dentures?: No Does patient usually wear dentures?: No  CIWA:    COWS:       Psychiatric Specialty Exam: Physical Exam  Constitutional: She appears well-developed.  HENT:  Head: Normocephalic.    Review of Systems  Cardiovascular: Negative for palpitations.  Gastrointestinal: Negative for nausea.  Psychiatric/Behavioral: Negative for hallucinations and substance abuse.  All other systems reviewed and are negative. no fever, no chills, reports improving suprapubic discomfort, denies dysuria.  Blood pressure 110/66, pulse (!) 102, temperature 98.7 F (37.1 C), resp. rate 18, height _0  (1.727 m), weight 88.5 kg (195 lb).Body mass index is 29.65 kg/m.  General Appearance: Well Groomed  Eye Contact:  Good  Speech:  Normal Rate  Volume:  Normal  Mood: sad but better then yesterday  Affect:  Less severely constricted    Thought Process:  Linear and Descriptions of Associations: Intact  Orientation:  Full (Time, Place, and Person)  Thought Content:  Denies hallucinations, no delusions   Suicidal Thoughts:  Denies self injurious or  suicidal plan or intention, contracts for safety on unit, endorses some passive SI   Homicidal Thoughts:  No- denies homicidal or violent ideations  Memory:  Recent and remote grossly intact   Judgement:  Improving   Insight:  Improving   Psychomotor Activity:  Normal  Concentration:  Concentration: Good and Attention Span: Good  Recall:  Good  Fund of Knowledge:  Good  Language:  Good  Akathisia:  No  Handed:    AIMS (if indicated):     Assets:  Communication Skills Desire for Improvement Resilience Social Support  ADL's:  Intact  Cognition:  WNL  Sleep:  Number of Hours: 6.75  Treatment Plan Summary: -Encourage group and milieu participation to work on coping skills and symptom reduction Tolerating meds. May consider discharge by Monday or Tuesday No dizziness reported keep ability at night.  -Continue Buspar 7.74m po bid for anxiety  -Continue Trazodone 521mpo qhs prn for insomnia -continue Zoloft  100 mg po daily for depression -Change the Abilify 2 mgrs to night dose as antidepressant augmentation strategy. -Continue Keflex course for UTI -Treatment team working on disposition planning    AKMerian CapronMD  10/05/2016, 11:27 AM

## 2016-10-05 NOTE — [facility] Group Notes (Signed)
[facility] Group Notes:  (Clinical Social Work)   10/05/2016    10:00-11:15AM  Summary of Progress/Problems:   The main focus of today's process group was to   1)  discuss the importance of adding supports  2)  identify the patient's current unhealthy supports and plan how to handle them  3)  Identify the patient's current healthy supports and plan what to add.  An emphasis was placed on using counselor, doctor, therapy groups, 12-step groups, and problem-specific support groups to expand supports.  Healthy boundaries were also discussed.  The patient expressed full comprehension of the concepts presented, and agreed that there is a need to add more supports.  The patient stated her 16yo daughter, 53yo niece and healthy supports for her, always encouraging her and providing hope.  Her friends who do not understand what she feels are unhealthy.  She provided good feedback to others today in group.  Type of Therapy:  Process Group with Motivational Interviewing  Participation Level:  Active  Participation Quality:  Appropriate, Attentive, Sharing and Supportive  Affect:  Blunted  Cognitive:  Alert, Appropriate and Oriented  Insight:  Engaged  Engagement in Therapy:  Engaged  Modes of Intervention:   Education, Support and Processing, Activity  Ambrose MantleMareida Grossman-Orr, LCSW 10/05/2016    12:36 PM

## 2016-10-05 NOTE — Progress Notes (Signed)
Patient ID: Teresa Hunter, female   DOB: 11-10-1965, 51 y.o.   MRN: [id number]  DAR: Pt. Denies HI and A/V Hallucinations. She reports passive SI but is able to contract for safety. She reports sleep is good, appetite is good, energy level is normal, and concentration is good. She rates depression 5/10, hopelessness 4/10, and anxiety 5/10. She received a PRN Vistaril this afternoon and writer spoke 1:1 with her. Pt reports anxiety related to discharge as she will be spending time at a family members house for awhile. She reports her husband is a Naval architecttruck driver and her family wants to make sure she is surrounded by support instead of being left alone at her house. Patient does not report any pain at this time. Support and encouragement provided to the patient. Scheduled medications administered to patient per physician's orders. Patient is minimal but cooperative. She is pleasant during interaction with this writer but affect and mood is sad. She is seen in the milieu intermittently interacting with peers and is attending groups. Q15 minute checks are maintained for safety.

## 2016-10-05 NOTE — [facility] Group Notes (Signed)
Goals group  Date:  10/05/2016  Time:  0930  Type of Therapy:  Group Therapy    :  The group focuses on teaching patients how to develop healthy goals that will  Aide them in their recovery.    Participation Level:  Active  Participation Quality:  Attentive  Affect:  Appropriate  Cognitive:  Alert  Insight:  Appropriate  Engagement in Group:  Engaged  Modes of Intervention:  Education  Summary of Progress/Problems:  Teresa BraveDuke, Teresa Hunter Lynn 10/05/2016, 12:38 PM

## 2016-10-05 NOTE — Progress Notes (Signed)
Patient attended group and she said her day was a 8. Her goal for today was to maintain positive thoughts and she did.

## 2016-10-06 MED ORDER — BUSPIRONE HCL 15 MG PO TABS
15.0000 mg | ORAL_TABLET | Freq: Two times a day (BID) | ORAL | Status: DC
  Administered 2016-10-06 – 2016-10-07 (×2): 15 mg via ORAL
  Filled 2016-10-06 (×7): qty 1

## 2016-10-06 MED ORDER — BUSPIRONE HCL 10 MG PO TABS
10.0000 mg | ORAL_TABLET | Freq: Two times a day (BID) | ORAL | Status: DC
  Filled 2016-10-06 (×4): qty 1

## 2016-10-06 NOTE — Progress Notes (Signed)
Patient ID: Teresa NoonLavonda Houser, female   DOB: 1966/03/18, 51 y.o.   MRN: [id number]  DAR: Pt. Denies HI and A/V Hallucinations. She reports passive SI but is able to contracts for safety. She continues to report some anxiety towards discharging but states, "I am glad my family is there to support me." She received PRN Vistaril which provided some relief. She reports sleep is fair, appetite is good, energy level is normal, and concentration is good. She rates depression 5/10, hopelessness 4/10, and anxiety 6/10. Patient does not report any pain at this time. Support and encouragement provided to the patient. Scheduled medications administered to patient per physician's orders. Patient is receptive and cooperative. She is seen in the milieu interacting with peers and is attending groups. Q15 minute checks are maintained for safety.

## 2016-10-06 NOTE — [facility] Group Notes (Signed)
[facility] LCSW Group Therapy  10/06/2016 1:15pm  Type of Therapy:  Group Therapy vercoming Obstacles  Participation Level:  Reserved  Participation Quality:  Appropriate   Affect: Flat  Cognitive:  Appropriate and Oriented  Insight:  Developing/Improving and Improving  Engagement in Therapy:  Improving  Modes of Intervention:  Discussion, Exploration, Problem-solving and Support  Description of Group:   In this group patients will be encouraged to explore what they see as obstacles to their own wellness and recovery. They will be guided to discuss their thoughts, feelings, and behaviors related to these obstacles. The group will process together ways to cope with barriers, with attention given to specific choices patients can make. Each patient will be challenged to identify changes they are motivated to make in order to overcome their obstacles. This group will be process-oriented, with patients participating in exploration of their own experiences as well as giving and receiving support and challenge from other group members.  Summary of Patient Progress: Pt expressed that her family has expectations that she is the strong person in the family so she feels pressure to live up to those expectations even when she feels otherwise. She interacted well with peers and processed aspects of healthy and unhealthy relationships.    Therapeutic Modalities:   Cognitive Behavioral Therapy Solution Focused Therapy Motivational Interviewing Relapse Prevention Therapy   Vernie ShanksLauren Vernadette Stutsman, LCSW 10/06/2016 3:46 PM

## 2016-10-06 NOTE — Progress Notes (Signed)
D: Patient reports being anxious of discharging home Monday or Tuesday. Patient stated "I don't know what the outside holds for me after I leave her. I will be staying with my dad since my husband is on the road all the time (truck driver). You know, someone has to police me especially with my medicine. The only option I have is to stay with my dad". Patient denies pain, SI/HI, AH/VH at this time. Rated anxiety 4/10 and depression 0/10. No behavioral issues noted.  A: Staff offered support and encouragement to patient. Due meds given as ordered. Every 15 minutes check for safety maintained. Will continue to monitor patient for safety and stability.  R: Patient receptive. Remains safe.

## 2016-10-06 NOTE — Progress Notes (Signed)
[facility] MD Progress Note  10/06/2016 4:04 PM Teresa Hunter  MRN:  [id number] Subjective:  Patient reports partial improvement compared to admission but continues to report a subjective sense of depression, anxiety. Tends to ruminate about family stressors. States she feels unsupported by her husband who is a long Company secretary and is often away for days. She states she is thinking of moving in with family members after discharge, for support. She reports anxiety about returning to work, particularly with regards to feeling she might be at risk of diverting medications " to try to feel better", although denies any pattern/history of substance abuse. At this time denies any suicidal ideations. Denies medication side effects.  Objective:  I have discussed case with treatment team and have met with patient . Patient presents with partial improvement compared to admission- remains depressed, but less severely than on admission and affect is more reactive, she is more verbal, and presents better related, more engaged in conversation, with better eye contact. As she improves she is better able to discuss disposition planning options but continues to express some anxiety and apprehension.  She is going to groups, no disruptive or agitated behaviors on unit  More responsive to support, encouragement , empathy, and affect tends to improve during session. Denies medication side effects. Focused on family stressors as above , but more future oriented .   Principal Problem: MDD (major depressive disorder), recurrent severe, without psychosis ([place]) Diagnosis:   Patient Active Problem List   Diagnosis Date Noted  . Severe recurrent major depression without psychotic features ([place]) [F33.2] 09/28/2016  . MDD (major depressive disorder), recurrent severe, without psychosis ([place]) [F33.2] 09/28/2016  . Chronic cholecystitis [K81.1] 07/14/2012   Total Time spent with patient: 20 minutes   Past Psychiatric  History: see H&P  Past Medical History:  Past Medical History:  Diagnosis Date  . Allergy   . Anxiety   . Asthma   . Family history of anesthesia complication    pt's mother slow to wake up   . GERD (gastroesophageal reflux disease)   . Hypertension 2005   stress related /weight fluctuates.   . Insomnia   . Migraine headache     Past Surgical History:  Procedure Laterality Date  . CHOLECYSTECTOMY  08/06/2012   Procedure: LAPAROSCOPIC CHOLECYSTECTOMY;  Surgeon: Harl Bowie, MD;  Location: [place];  Service: General;  Laterality: N/A;  . COLONOSCOPY  2013 october- Dr Collene Mares   removal of polyps with endoscopy /colonoscopy  . EYE SURGERY     laser  . laser     B/L glaucoma  . TONSILLECTOMY     Family History:  Family History  Problem Relation Age of Onset  . Cancer Mother     multiple myeloma  . Hypertension Sister   . Cancer Maternal Grandmother   . Cancer Maternal Grandfather   . Hypertension Paternal Grandmother   . Cancer Paternal Grandfather    Family Psychiatric  History: see H&P Social History:  History  Alcohol Use No     History  Drug Use No    Social History   Social History  . Marital status: Married    Spouse name: N/A  . Number of children: N/A  . Years of education: N/A   Social History Main Topics  . Smoking status: Never Smoker  . Smokeless tobacco: Never Used  . Alcohol use No  . Drug use: No  . Sexual activity: Yes   Other Topics Concern  . None  Social History Narrative  . None   Additional Social History:   Sleep: improved   Appetite:  Improved   Current Medications: Current Facility-Administered Medications  Medication Dose Route Frequency Provider Last Rate Last Dose  . acetaminophen (TYLENOL) tablet 650 mg  650 mg Oral Q6H PRN Patrecia Pour, NP   650 mg at 09/29/16 0905  . albuterol (PROVENTIL HFA;VENTOLIN HFA) 108 (90 Base) MCG/ACT inhaler 1-2 puff  1-2 puff Inhalation Q6H PRN Patrecia Pour, NP      . albuterol  (PROVENTIL) (2.5 MG/3ML) 0.083% nebulizer solution 3 mL  3 mL Inhalation Q6H PRN Patrecia Pour, NP      . alum & mag hydroxide-simeth (MAALOX/MYLANTA) 200-200-20 MG/5ML suspension 30 mL  30 mL Oral Q4H PRN Patrecia Pour, NP      . ARIPiprazole (ABILIFY) tablet 2 mg  2 mg Oral QHS Kerrie Buffalo, NP   2 mg at 10/05/16 2116  . busPIRone (BUSPAR) tablet 10 mg  10 mg Oral BID Myer Peer Cobos, MD      . cephALEXin (KEFLEX) capsule 500 mg  500 mg Oral Q12H Jenne Campus, MD   500 mg at 10/06/16 0802  . hydrOXYzine (ATARAX/VISTARIL) tablet 50 mg  50 mg Oral BID PRN Ursula Alert, MD   50 mg at 10/06/16 0803  . magnesium hydroxide (MILK OF MAGNESIA) suspension 30 mL  30 mL Oral Daily PRN Patrecia Pour, NP      . sertraline (ZOLOFT) tablet 100 mg  100 mg Oral Daily Jenne Campus, MD   100 mg at 10/06/16 0802  . traZODone (DESYREL) tablet 50 mg  50 mg Oral QHS PRN Jenne Campus, MD   50 mg at 10/05/16 2118    Lab Results:  No results found for this or any previous visit (from the past 58 hour(s)).  Blood Alcohol level:  Lab Results  Component Value Date   ETH <5 25/63/8937    Metabolic Disorder Labs: No results found for: HGBA1C, MPG No results found for: PROLACTIN No results found for: CHOL, TRIG, HDL, CHOLHDL, VLDL, LDLCALC  Physical Findings: AIMS: Facial and Oral Movements Muscles of Facial Expression: None, normal Lips and Perioral Area: None, normal Jaw: None, normal Tongue: None, normal,Extremity Movements Upper (arms, wrists, hands, fingers): None, normal Lower (legs, knees, ankles, toes): None, normal, Trunk Movements Neck, shoulders, hips: None, normal, Overall Severity Severity of abnormal movements (highest score from questions above): None, normal Incapacitation due to abnormal movements: None, normal Patient's awareness of abnormal movements (rate only patient's report): No Awareness, Dental Status Current problems with teeth and/or dentures?: No Does patient  usually wear dentures?: No  CIWA:    COWS:       Psychiatric Specialty Exam: Physical Exam  Constitutional: She appears well-developed.  HENT:  Head: Normocephalic.    Review of Systems  Cardiovascular: Negative for palpitations.  Gastrointestinal: Negative for nausea.  Psychiatric/Behavioral: Negative for hallucinations and substance abuse.  All other systems reviewed and are negative. no fever, no chills,no dysuria or urgency   Blood pressure 125/77, pulse (!) 104, temperature 97.8 F (36.6 C), temperature source Oral, resp. rate 18, height _0  (1.727 m), weight 88.5 kg (195 lb).Body mass index is 29.65 kg/m.  General Appearance: Well Groomed  Eye Contact:  Good  Speech:  Normal Rate  Volume:  Normal  Mood:  Gradual improvement in mood, but remains depressed, anxious  Affect:  More reactive, smiles at times, appropriately, affect improves during session,  remains anxious   Thought Process:  Linear and Descriptions of Associations: Intact  Orientation:  Full (Time, Place, and Person)  Thought Content:  Denies hallucinations, no delusions   Suicidal Thoughts:  Denies any current  self injurious or  suicidal plan or intention, contracts for safety on unit  Homicidal Thoughts:  No- denies homicidal or violent ideations  Memory:  Recent and remote grossly intact   Judgement:  Improving   Insight:  Improving   Psychomotor Activity:  Normal  Concentration:  Concentration: Good and Attention Span: Good  Recall:  Good  Fund of Knowledge:  Good  Language:  Good  Akathisia:  No  Handed:    AIMS (if indicated):     Assets:  Communication Skills Desire for Improvement Resilience Social Support  ADL's:  Intact  Cognition:  WNL  Sleep:  Number of Hours: 6.75    Assessment - patient has improved partially since admission- presents more verbal, interactive, better groomed, better eye contact, more future oriented, denies any active SI. She does, however, endorse residual  symptoms of depression, and improvement has been partial. She remains anxious, ruminative. At this time tolerating medications well.  Treatment Plan Summary: -Encourage group and milieu participation to work on coping skills and symptom reduction -Increase Buspar to 15 mgrs BID for anxiety -Continue Trazodone 76m po qhs prn for insomnia -Continue Vistaril 50 mgrs BID PRN for anxiety as needed -Continue Zoloft  100 mg po daily for depression -Continue Abilify 2 mgrs QHS  as antidepressant augmentation strategy. -Completing Keflex course for UTI -Treatment team working on disposition planning    CNeita Garnet MD  10/06/2016, 4:04 PM   Patient ID: LQuentin Angst female   DOB: [birth year] 51y.o.   MRN: [id number]

## 2016-10-06 NOTE — Progress Notes (Signed)
Recreation Therapy Notes   Date: 10/06/16 Time: 0930 Location: [address] Group Room  Group Topic: Stress Management  Goal Area(s) Addresses:  Patient will verbalize importance of using healthy stress management.  Patient will identify positive emotions associated with healthy stress management.   Intervention: Stress Management   Activity :  Body Scan Meditation.  LRT introduced the stress management technique of meditation to patients.  LRT played a meditation from the Calm app to allow patients the opportunity to engage in the meditation.  Patients were to follow along as the meditation played.  Education:  Stress Management, Discharge Planning.   Education Outcome: Acknowledges edcuation/In group clarification offered/Needs additional education  Clinical Observations/Feedback: Pt did not attend group.    Caroll RancherMarjette Autymn Omlor, LRT/CTRS         Caroll RancherLindsay, Penne Rosenstock A 10/06/2016 12:20 PM

## 2016-10-06 NOTE — Progress Notes (Signed)
Patient ID: Teresa HazaiChesley Noonmh, female   DOB: 07-08-1966, 51 y.o.   MRN: [id number] D: Client visible on the unit, seen in dayroom interacting with peers and on the phone. Client reports visit from husband tonight. Client is reports "got a little anxious, thought I was going home" "glad I didn't cause I know how it is here, at home they say they understand but they don't, they think I can just snap right back, I tell them it don't work like that" client reports family declined family session "said they will learn from me" "I'm going to have to educate them"  client reports this admission has been helpful "I like to talk to people" "everybody here understand, I can talk to them" Client reports anxiety "4" of 10. A: Writer provided emotional support, encouraged her to continue to ask family to attend support groups, but stay focused on her mental well-being. Medications reviewed, administered as ordered. Staff will monitor q3515min for safety. R: Client is safe on the unit.

## 2016-10-07 MED ORDER — SERTRALINE HCL 100 MG PO TABS: 100.0000 mg | ORAL_TABLET | Freq: Every day | ORAL | 0 refills | Status: AC

## 2016-10-07 MED ORDER — ARIPIPRAZOLE 2 MG PO TABS: 2.0000 mg | ORAL_TABLET | Freq: Every day | ORAL | 0 refills | Status: AC

## 2016-10-07 MED ORDER — HYDROXYZINE HCL 50 MG PO TABS: 50.0000 mg | ORAL_TABLET | Freq: Two times a day (BID) | ORAL | 0 refills | Status: AC | PRN

## 2016-10-07 MED ORDER — BUSPIRONE HCL 15 MG PO TABS: 15.0000 mg | ORAL_TABLET | Freq: Two times a day (BID) | ORAL | 0 refills | Status: AC

## 2016-10-07 MED ORDER — TRAZODONE HCL 50 MG PO TABS: 50.0000 mg | ORAL_TABLET | Freq: Every evening | ORAL | 0 refills | Status: AC | PRN

## 2016-10-07 MED ORDER — ALBUTEROL SULFATE HFA 108 (90 BASE) MCG/ACT IN AERS: 1.0000 | INHALATION_SPRAY | Freq: Four times a day (QID) | RESPIRATORY_TRACT | 0 refills | Status: AC | PRN

## 2016-10-07 NOTE — Progress Notes (Signed)
Pt's goal for today is to prepare mentally for discharge. Pt denies voices si and hi. Pt is pleasant and anxious about leaving.

## 2016-10-07 NOTE — Progress Notes (Signed)
Pt d/c from the hospital. All items returned. D/C instructions given and prescriptions given. Pt denies si and hi. 

## 2016-10-07 NOTE — Discharge Summary (Signed)
Physician Discharge Summary Note  Patient:  Teresa Hunter is an 51 y.o., female MRN:  [id number] DOB:  10-08-1965 Patient phone:  [phone] (home)  Patient address:   [address] [place] [address],  Total Time spent with patient: 30 minutes  Date of Admission:  09/28/2016 Date of Discharge: 10/07/2016  Reason for Admission:  Suicide attempt via overdose  Principal Problem: MDD (major depressive disorder), recurrent severe, without psychosis [facility]) Discharge Diagnoses: Patient Active Problem List   Diagnosis Date Noted  . Severe recurrent major depression without psychotic features ([place]) [F33.2] 09/28/2016  . MDD (major depressive disorder), recurrent severe, without psychosis ([place]) [F33.2] 09/28/2016  . Chronic cholecystitis [K81.1] 07/14/2012    Past Psychiatric History: see HPI  Past Medical History:  Past Medical History:  Diagnosis Date  . Allergy   . Anxiety   . Asthma   . Family history of anesthesia complication    pt's mother slow to wake up   . GERD (gastroesophageal reflux disease)   . Hypertension 2005   stress related /weight fluctuates.   . Insomnia   . Migraine headache     Past Surgical History:  Procedure Laterality Date  . CHOLECYSTECTOMY  08/06/2012   Procedure: LAPAROSCOPIC CHOLECYSTECTOMY;  Surgeon: Harl Bowie, MD;  Location: [facility];  Service: General;  Laterality: N/A;  . COLONOSCOPY  2013 october- Dr Collene Mares   removal of polyps with endoscopy /colonoscopy  . EYE SURGERY     laser  . laser     B/L glaucoma  . TONSILLECTOMY     Family History:  Family History  Problem Relation Age of Onset  . Cancer Mother     multiple myeloma  . Hypertension Sister   . Cancer Maternal Grandmother   . Cancer Maternal Grandfather   . Hypertension Paternal Grandmother   . Cancer Paternal Grandfather    Family Psychiatric  History: see HPI Social History:  History  Alcohol Use No     History  Drug Use No    Social History    Social History  . Marital status: Married    Spouse name: N/A  . Number of children: N/A  . Years of education: N/A   Social History Main Topics  . Smoking status: Never Smoker  . Smokeless tobacco: Never Used  . Alcohol use No  . Drug use: No  . Sexual activity: Yes   Other Topics Concern  . None   Social History Narrative  . None    Hospital Course:   Teresa Hunter was admitted for MDD (major depressive disorder), recurrent severe, without psychosis ([place]) and crisis management.  Patient was treated with medications with their indications listed below in detail under Medication List.  Medical problems were identified and treated as needed.  Home medications were restarted as appropriate.  Improvement was monitored by observation and Teresa Hunter daily report of symptom reduction.  Emotional and mental status was monitored by daily self inventory reports completed by Teresa Hunter and clinical staff.  Patient reported continued improvement, denied any new concerns.  Patient had been compliant on medications and denied side effects.  Support and encouragement was provided.         Teresa Hunter was evaluated by the treatment team for stability and plans for continued recovery upon discharge.  Patient was offered further treatment options upon discharge including Residential, Intensive Outpatient and Outpatient treatment. Patient will follow up with agency listed below for medication management and counseling.  Encouraged patient  to maintain satisfactory support network and home environment.  Advised to adhere to medication compliance and outpatient treatment follow up.  Prescriptions provided.       Teresa Hunter motivation was an integral factor for scheduling further treatment.  Employment, transportation, bed availability, health status, family support, and any pending legal issues were also considered during patient's hospital stay.  Upon completion of this admission the  patient was both mentally and medically stable for discharge denying suicidal/homicidal ideation, auditory/visual/tactile hallucinations, delusional thoughts and paranoia.      Physical Findings: AIMS: Facial and Oral Movements Muscles of Facial Expression: None, normal Lips and Perioral Area: None, normal Jaw: None, normal Tongue: None, normal,Extremity Movements Upper (arms, wrists, hands, fingers): None, normal Lower (legs, knees, ankles, toes): None, normal, Trunk Movements Neck, shoulders, hips: None, normal, Overall Severity Severity of abnormal movements (highest score from questions above): None, normal Incapacitation due to abnormal movements: None, normal Patient's awareness of abnormal movements (rate only patient's report): No Awareness, Dental Status Current problems with teeth and/or dentures?: No Does patient usually wear dentures?: No  CIWA:    COWS:     Musculoskeletal: Strength & Muscle Tone: within normal limits Gait & Station: normal Patient leans: N/A  Psychiatric Specialty Exam: Physical Exam  Nursing note and vitals reviewed. Psychiatric: She has a normal mood and affect. Her behavior is normal. Judgment and thought content normal. Cognition and memory are normal.    Review of Systems  Constitutional: Negative.   HENT: Negative.   Eyes: Negative.   Respiratory: Negative.   Cardiovascular: Negative.   Gastrointestinal: Negative.   Genitourinary: Negative.   Musculoskeletal: Negative.   Skin: Negative.   Neurological: Negative.   Endo/Heme/Allergies: Negative.   Psychiatric/Behavioral: Negative.     Blood pressure 123/73, pulse (!) 113, temperature 98.6 F (37 C), temperature source Oral, resp. rate 16, height '5\' 8"'  (1.727 m), weight 88.5 kg (195 lb).Body mass index is 29.65 kg/m.    Have you used any form of tobacco in the last 30 days? (Cigarettes, Smokeless Tobacco, Cigars, and/or Pipes): No  Has this patient used any form of tobacco in the  last 30 days? (Cigarettes, Smokeless Tobacco, Cigars, and/or Pipes) Yes, N/A  Blood Alcohol level:  Lab Results  Component Value Date   ETH <5 65/53/7482    Metabolic Disorder Labs:  No results found for: HGBA1C, MPG No results found for: PROLACTIN No results found for: CHOL, TRIG, HDL, CHOLHDL, VLDL, LDLCALC  See Psychiatric Specialty Exam and Suicide Risk Assessment completed by Attending Physician prior to discharge.  Discharge destination:  Home  Is patient on multiple antipsychotic therapies at discharge:  No   Has Patient had three or more failed trials of antipsychotic monotherapy by history:  No  Recommended Plan for Multiple Antipsychotic Therapies: NA   Allergies as of 10/07/2016      Reactions   Metformin And Related Nausea And Vomiting      Medication List    STOP taking these medications   clonazePAM 0.5 MG tablet Commonly known as:  KLONOPIN   dicyclomine 20 MG tablet Commonly known as:  BENTYL   enoxaparin 150 MG/ML injection Commonly known as:  LOVENOX   ibuprofen 200 MG tablet Commonly known as:  ADVIL,MOTRIN   meloxicam 7.5 MG tablet Commonly known as:  MOBIC   phentermine 37.5 MG capsule   THERA-M Tabs   warfarin 5 MG tablet Commonly known as:  COUMADIN     TAKE these medications  Indication  albuterol 108 (90 Base) MCG/ACT inhaler Commonly known as:  PROVENTIL HFA;VENTOLIN HFA Inhale 1-2 puffs into the lungs every 6 (six) hours as needed for wheezing or shortness of breath.  Indication:  Disease Involving Spasms of the Bronchus   ARIPiprazole 2 MG tablet Commonly known as:  ABILIFY Take 1 tablet (2 mg total) by mouth at bedtime.  Indication:  mood stabilization   busPIRone 15 MG tablet Commonly known as:  BUSPAR Take 1 tablet (15 mg total) by mouth 2 (two) times daily.  Indication:  Anxiety Disorder   hydrOXYzine 50 MG tablet Commonly known as:  ATARAX/VISTARIL Take 1 tablet (50 mg total) by mouth 2 (two) times daily as  needed for anxiety.  Indication:  Anxiety Neurosis   sertraline 100 MG tablet Commonly known as:  ZOLOFT Take 1 tablet (100 mg total) by mouth daily. Start taking on:  10/08/2016  Indication:  Major Depressive Disorder   traZODone 50 MG tablet Commonly known as:  DESYREL Take 1 tablet (50 mg total) by mouth at bedtime as needed for sleep.  Indication:  [place] Follow up on 10/09/2016.   Why:  at 4:30am with Unknown Jim for medication management.  Contact information: [address] [place], [place] [address] [phone] Fax: [phone]       Office of Dr. Geanie Kenning Follow up on 10/08/2016.   Why:  at 2:30pm am for therapy with Timmothy Sours. Please arrive 10 minutes prior to fill out paperwork.  Contact information: [address] [place], [place] [address] [phone] Fax: [phone]          Follow-up recommendations:  Activity:  as tol Diet:  as tol  Comments:  1.  Take all your medications as prescribed.   2.  Report any adverse side effects to outpatient provider. 3.  Patient instructed to not use alcohol or illegal drugs while on prescription medicines. 4.  In the event of worsening symptoms, instructed patient to call 911, the crisis hotline or go to nearest emergency room for evaluation of symptoms.  Signed: Janett Labella, NP [facility] 10/07/2016, 12:30 PM   Patient seen, Suicide Assessment Completed.  Disposition Plan Reviewed

## 2016-10-07 NOTE — [facility] Suicide Risk Assessment (Addendum)
[facility]   Principal Problem: MDD (major depressive disorder), recurrent severe, without psychosis ([facility]) Discharge Diagnoses:  Patient Active Problem List   Diagnosis Date Noted  . Severe recurrent major depression without psychotic features ([facility]) [F33.2] 09/28/2016  . MDD (major depressive disorder), recurrent severe, without psychosis ([facility]) [F33.2] 09/28/2016  . Chronic cholecystitis [K81.1] 07/14/2012    Total Time spent with patient: 30 minutes  Musculoskeletal: Strength & Muscle Tone: within normal limits Gait & Station: normal Patient leans: N/A  Psychiatric Specialty Exam: ROS denies headache, denies chest pain, denies shortness of breath  Blood pressure 123/73, pulse (!) 113, temperature 98.6 F (37 C), temperature source Oral, resp. rate 16, height 5\' 8"  (1.727 m), weight 88.5 kg (195 lb).Body mass index is 29.65 kg/m.  General Appearance: Well Groomed  Eye Contact::  Good  Speech:  Normal Rate409  Volume:  Normal  Mood:  reports feeling better today, states she feels her mood is improving, lifiting  Affect:  presents with improved range of affect, smiling at times appropriately   Thought Process:  Linear  Orientation:  Full (Time, Place, and Person)  Thought Content:  no hallucinations, no delusions, not internally preoccupied  Suicidal Thoughts:  No- denies any current plan or intention of suicide or of self injurious ideations   Homicidal Thoughts:  No denies any homicidal ideations   Memory:  recent and remote grossly intact   Judgement:  Other:  improving   Insight:  improving   Psychomotor Activity:  Normal  Concentration:  Good  Recall:  Good  Fund of Knowledge:Good  Language: Good  Akathisia:  Negative  Handed:  Right  AIMS (if indicated):   no abnormal involuntary movements noted or reported  Assets:  Communication Skills Desire for Improvement Resilience  Sleep:  Number of Hours: 6  Cognition: WNL  ADL's:  Intact    Mental Status Per Nursing Assessment::   On Admission:     Demographic Factors:  51 year old female, married, plans to go live with her father  Loss Factors: Recent death of mother, marital strain   Historical Factors: History of depression  Risk Reduction Factors:   Living with another person, especially a relative, Positive social support and Positive coping skills or problem solving skills  Continued Clinical Symptoms:  At this time patient presents improved , reports improving mood , exhibiting an improving range of affect, smiling at times appropriately, no thought disorder ,denies any  suicidal or self injurious ideations, denies any violent or homicidal ideations , no delusions expressed , no hallucinations, more  future oriented .At this time planning on going to live with her father, whom she states will provide a supportive environment for her.  She states her family has been supportive and a number of family members have told her she can call them at any time for support , if needed- this has contributed to patient feeling better.  Denies medication side effects.  Cognitive Features That Contribute To Risk:  No gross cognitive deficits noted upon discharge. Is alert , attentive, and oriented x 3   Suicide Risk:  Mild:  Suicidal ideation of limited frequency, intensity, duration, and specificity.  There are no identifiable plans, no associated intent, mild dysphoria and related symptoms, good self-control (both objective and subjective assessment), few other risk factors, and identifiable protective factors, including available and accessible social support.  Follow-up Information    [facility] Follow up on 10/09/2016.   Why:  at 4:30am  with Halimena Creque for medication management.  Contact information: [address] [place], [place] [address] [phone] Fax: [phone]       Office of Dr. Lowanda Foster Follow up on 10/08/2016.   Why:  at  2:30pm am for therapy with Roe Coombs. Please arrive 10 minutes prior to fill out paperwork.  Contact information: [address] [place], [place] [address] [phone] Fax: [phone]          Plan Of Care/Follow-up recommendations:  Activity:  as tolerated Diet:  Regular Tests:  NA Other:  See below  Patient is leaving unit in good spirits  Plans to go live with her father, whom she describes as very supportive  Plans to follow up with her PCP, Dr. Beatriz Stallion , at [facility] [place].  Nehemiah Massed, MD 10/07/2016, 9:56 AM

## 2016-10-07 NOTE — Progress Notes (Addendum)
  [facility]  Will you be returning to the same living situation after discharge:  No. Pt plans to stay with her father at discharge At discharge, do you have transportation home?: Yes,  Pt brother to pick up Do you have the ability to pay for your medications: Yes,  Pt provided with prescriptions  Release of information consent forms completed and in the chart;  Patient's signature needed at discharge.  Patient to Follow up at: Follow-up Information    [facility] Follow up on 10/09/2016.   Why:  at 4:30am with Danton SewerHalimena Creque for medication management.  Contact information: [address] [place], [place] [address] [phone] Fax: [phone]       Office of Dr. Lowanda FosterBeverly Jones Follow up on 10/08/2016.   Why:  at 2:30pm am for therapy with Roe Coombson. Please arrive 10 minutes prior to fill out paperwork.  Contact information: [address] [place], [place] [address] [phone] Fax: [phone]          Next level of care provider [facility] Link:no  Safety Planning and Suicide Prevention discussed: Yes,  with husband; see SPE note  Have you used any form of tobacco in the last 30 days? (Cigarettes, Smokeless Tobacco, Cigars, and/or Pipes): No  Has patient been referred to the Quitline?: N/A patient is not a smoker  Patient has been referred for addiction treatment: N/A  Teresa LennertLauren C Nakeitha Hunter 10/07/2016, 11:04 AM

## 2016-10-07 NOTE — Tx Team (Signed)
Interdisciplinary Treatment and Diagnostic Plan Update  10/07/2016 Time of Session: 11:01 AM  Teresa Hunter MRN: [id number]  Principal Diagnosis: MDD (major depressive disorder), recurrent severe, without psychosis (HCC)  Secondary Diagnoses: Principal Problem:   MDD (major depressive disorder), recurrent severe, without psychosis (HCC)   Current Medications:  Current Facility-Administered Medications  Medication Dose Route Frequency Provider Last Rate Last Dose  . acetaminophen (TYLENOL) tablet 650 mg  650 mg Oral Q6H PRN Charm Rings, NP   650 mg at 09/29/16 0905  . albuterol (PROVENTIL HFA;VENTOLIN HFA) 108 (90 Base) MCG/ACT inhaler 1-2 puff  1-2 puff Inhalation Q6H PRN Charm Rings, NP      . albuterol (PROVENTIL) (2.5 MG/3ML) 0.083% nebulizer solution 3 mL  3 mL Inhalation Q6H PRN Charm Rings, NP      . alum & mag hydroxide-simeth (MAALOX/MYLANTA) 200-200-20 MG/5ML suspension 30 mL  30 mL Oral Q4H PRN Charm Rings, NP      . ARIPiprazole (ABILIFY) tablet 2 mg  2 mg Oral QHS Adonis Brook, NP   2 mg at 10/06/16 2122  . busPIRone (BUSPAR) tablet 15 mg  15 mg Oral BID Craige Cotta, MD   15 mg at 10/07/16 0800  . cephALEXin (KEFLEX) capsule 500 mg  500 mg Oral Q12H Rockey Situ Cobos, MD   500 mg at 10/07/16 0800  . hydrOXYzine (ATARAX/VISTARIL) tablet 50 mg  50 mg Oral BID PRN Jomarie Longs, MD   50 mg at 10/06/16 0803  . magnesium hydroxide (MILK OF MAGNESIA) suspension 30 mL  30 mL Oral Daily PRN Charm Rings, NP      . sertraline (ZOLOFT) tablet 100 mg  100 mg Oral Daily Rockey Situ Cobos, MD   100 mg at 10/07/16 0800  . traZODone (DESYREL) tablet 50 mg  50 mg Oral QHS PRN Craige Cotta, MD   50 mg at 10/06/16 2122    PTA Medications: Prescriptions Prior to Admission  Medication Sig Dispense Refill Last Dose  . albuterol (PROVENTIL HFA;VENTOLIN HFA) 108 (90 BASE) MCG/ACT inhaler Inhale 1-2 puffs into the lungs every 6 (six) hours as needed for wheezing or  shortness of breath. (Patient not taking: Reported on 09/28/2016) 1 Inhaler 0 Not Taking at Unknown time  . clonazePAM (KLONOPIN) 0.5 MG tablet Take 0.25 mg by mouth daily as needed for anxiety.   Past Week at Unknown time  . dicyclomine (BENTYL) 20 MG tablet Take 20 mg by mouth daily.   Past Month at Unknown time  . enoxaparin (LOVENOX) 150 MG/ML injection 0.9 mL (=130mg  = 1 mg/kg) [place] q12 hrs until directed by physician AND INR therapeutic (Patient not taking: Reported on 09/28/2016) 14 mL 1 Not Taking at Unknown time  . ibuprofen (ADVIL,MOTRIN) 200 MG tablet Take 400 mg by mouth every 8 (eight) hours as needed for headache.   Past Week at Unknown time  . meloxicam (MOBIC) 7.5 MG tablet Take 1 tablet (7.5 mg total) by mouth daily. May take 2 if needed (Patient not taking: Reported on 09/28/2016) 60 tablet 1 Not Taking at Unknown time  . Multiple Vitamins-Minerals (THERA-M) TABS Take 1 tablet by mouth daily.   Past Month at Unknown time  . phentermine 37.5 MG capsule Take 37.5 mg by mouth every morning.   Not Taking at Unknown time  . warfarin (COUMADIN) 5 MG tablet Take 1 tablet (5 mg total) by mouth daily. (Patient not taking: Reported on 09/28/2016) 30 tablet 0 Not Taking at Unknown time  Treatment Modalities: Medication Management, Group therapy, Case management,  1 to 1 session with clinician, Psychoeducation, Recreational therapy.  Patient Stressors: Marital or family conflict Medication change or noncompliance  Patient Strengths: Ability for insight Average or above average intelligence Capable of independent living Wellsite geologistCommunication skills General fund of knowledge Motivation for treatment/growth  Physician Treatment Plan for Primary Diagnosis: MDD (major depressive disorder), recurrent severe, without psychosis (HCC) Long Term Goal(s): Improvement in symptoms so as ready for discharge  Short Term Goals: Ability to identify changes in lifestyle to reduce recurrence of condition will  improve Ability to verbalize feelings will improve Ability to disclose and discuss suicidal ideas Ability to demonstrate self-control will improve Ability to identify and develop effective coping behaviors will improve Ability to maintain clinical measurements within normal limits will improve Compliance with prescribed medications will improve Ability to identify changes in lifestyle to reduce recurrence of condition will improve Ability to verbalize feelings will improve Ability to disclose and discuss suicidal ideas Ability to demonstrate self-control will improve Ability to identify and develop effective coping behaviors will improve Ability to maintain clinical measurements within normal limits will improve Compliance with prescribed medications will improve  Medication Management: Evaluate patient's response, side effects, and tolerance of medication regimen.  Therapeutic Interventions: 1 to 1 sessions, Unit Group sessions and Medication administration.  Evaluation of Outcomes: Adequate for Discharge  Physician Treatment Plan for Secondary Diagnosis: Principal Problem:   MDD (major depressive disorder), recurrent severe, without psychosis (HCC)   Long Term Goal(s): Improvement in symptoms so as ready for discharge  Short Term Goals: Ability to identify changes in lifestyle to reduce recurrence of condition will improve Ability to verbalize feelings will improve Ability to disclose and discuss suicidal ideas Ability to demonstrate self-control will improve Ability to identify and develop effective coping behaviors will improve Ability to maintain clinical measurements within normal limits will improve Compliance with prescribed medications will improve Ability to identify changes in lifestyle to reduce recurrence of condition will improve Ability to verbalize feelings will improve Ability to disclose and discuss suicidal ideas Ability to demonstrate self-control will  improve Ability to identify and develop effective coping behaviors will improve Ability to maintain clinical measurements within normal limits will improve Compliance with prescribed medications will improve  Medication Management: Evaluate patient's response, side effects, and tolerance of medication regimen.  Therapeutic Interventions: 1 to 1 sessions, Unit Group sessions and Medication administration.  Evaluation of Outcomes: Adequate for Discharge   RN Treatment Plan for Primary Diagnosis: MDD (major depressive disorder), recurrent severe, without psychosis (HCC) Long Term Goal(s): Knowledge of disease and therapeutic regimen to maintain health will improve  Short Term Goals: Ability to verbalize feelings will improve, Ability to disclose and discuss suicidal ideas and Ability to identify and develop effective coping behaviors will improve  Medication Management: RN will administer medications as ordered by provider, will assess and evaluate patient's response and provide education to patient for prescribed medication. RN will report any adverse and/or side effects to prescribing provider.  Therapeutic Interventions: 1 on 1 counseling sessions, Psychoeducation, Medication administration, Evaluate responses to treatment, Monitor vital signs and CBGs as ordered, Perform/monitor CIWA, COWS, AIMS and Fall Risk screenings as ordered, Perform wound care treatments as ordered.  Evaluation of Outcomes: Adequate for Discharge   LCSW Treatment Plan for Primary Diagnosis: MDD (major depressive disorder), recurrent severe, without psychosis (HCC) Long Term Goal(s): Safe transition to appropriate next level of care at discharge, Engage patient in therapeutic group addressing interpersonal concerns.  Short Term Goals: Engage patient in aftercare planning with referrals and resources, Identify triggers associated with mental health/substance abuse issues and Increase skills for wellness and  recovery  Therapeutic Interventions: Assess for all discharge needs, 1 to 1 time with Social worker, Explore available resources and support systems, Assess for adequacy in community support network, Educate family and significant other(s) on suicide prevention, Complete Psychosocial Assessment, Interpersonal group therapy.  Evaluation of Outcomes: Adequate for Discharge   Progress in Treatment: Attending groups: Yes  Participating in groups: Yes Taking medication as prescribed: Yes, MD continues to assess for medication changes as needed Toleration medication: Yes, no side effects reported at this time Family/Significant other contact made: Yes with husband Patient understands diagnosis: Yes AEB willingness to participate in treatment Discussing patient identified problems/goals with staff: Yes Medical problems stabilized or resolved: Yes Denies suicidal/homicidal ideation: Yes Issues/concerns per patient self-inventory: None Other: N/A  New problem(s) identified: None identified at this time.   New Short Term/Long Term Goal(s): None identified at this time.   Discharge Plan or Barriers: Pt plans to go live with her father and follow-up with outpatient resources   Reason for Continuation of Hospitalization: None identified at this time.   Estimated Length of Stay: 0 days  Attendees: Patient: 10/07/2016  11:01 AM  Physician: Dr. Jama Flavors 10/07/2016  11:01 AM  Nursing: Waynetta Sandy, RN; Midge Aver, RN 10/07/2016  11:01 AM  RN Care Manager: Onnie Boer, RN 10/07/2016  11:01 AM  Social Worker: Vernie Shanks, LCSW; Heather Smart, LCSW 10/07/2016  11:01 AM  Recreational Therapist:  10/07/2016  11:01 AM  Other: Armandina Stammer, NP; Gray Bernhardt, NP 10/07/2016  11:01 AM  Other:  10/07/2016  11:01 AM  Other: 10/07/2016  11:01 AM    Scribe for Treatment Team: [name], LCSW 10/07/2016 11:01 AM

## 2016-10-07 NOTE — Progress Notes (Signed)
Adult Psychoeducational Group Note  Date:  10/07/2016 Time:  12:31 AM  Group Topic/Focus:  Wrap-Up Group:   The focus of this group is to help patients review their daily goal of treatment and discuss progress on daily workbooks.   Participation Level:  Active  Participation Quality:  Appropriate  Affect:  Appropriate  Cognitive:  Alert  Insight: Appropriate  Engagement in Group:  Engaged  Modes of Intervention:  Discussion  Additional Comments:  On a scale between 1-10, (1=worse, 10=best) patient rates her day a 9. Patient's goal for today was to be more positive. Atlas Kuc L Harman Ferrin 10/07/2016, 12:31 AM

## 2016-10-07 NOTE — Progress Notes (Signed)
[facility] Group Notes:  (Nursing/MHT/Case Management/Adjunct)  Date:  10/07/2016  Time:  12:36 PM  Type of Therapy:  Nurse Education  Participation Level:  Active  Participation Quality:  Appropriate  Affect:  Appropriate  Cognitive:  Alert and Oriented  Insight:  Appropriate  Engagement in Group:  Engaged and Supportive  Modes of Intervention:  Activity, Clarification, Discussion, Education, Socialization and Support  Summary of Progress/Problems:The purpose of this group is to support patients to identify a goal for today and to introduce patients to the benefits of aromatherapy.  Beatrix ShipperWright, Laree Garron Martin 10/07/2016, 12:36 PM

## 2016-11-29 ENCOUNTER — Other Ambulatory Visit (HOSPITAL_COMMUNITY): Payer: Self-pay | Admitting: Psychiatry
